# Patient Record
Sex: Female | Born: 1950 | Race: White | Hispanic: No | Marital: Married | Smoking: Current every day smoker
Health system: Southern US, Community
[De-identification: ages and names within clinical notes are randomized; demographics above are authoritative.]

## PROBLEM LIST (undated history)

## (undated) DIAGNOSIS — J449 Chronic obstructive pulmonary disease, unspecified: Secondary | ICD-10-CM

## (undated) DIAGNOSIS — C801 Malignant (primary) neoplasm, unspecified: Secondary | ICD-10-CM

## (undated) DIAGNOSIS — Z720 Tobacco use: Secondary | ICD-10-CM

## (undated) DIAGNOSIS — F209 Schizophrenia, unspecified: Secondary | ICD-10-CM

## (undated) HISTORY — PX: NO PAST SURGERIES: SHX2092

## (undated) HISTORY — PX: OTHER SURGICAL HISTORY: SHX169

---

## 2000-09-23 ENCOUNTER — Inpatient Hospital Stay (HOSPITAL_COMMUNITY): Admission: EM | Admit: 2000-09-23 | Discharge: 2000-09-28 | Payer: Self-pay | Admitting: *Deleted

## 2001-01-19 ENCOUNTER — Inpatient Hospital Stay (HOSPITAL_COMMUNITY): Admission: EM | Admit: 2001-01-19 | Discharge: 2001-01-25 | Payer: Self-pay | Admitting: *Deleted

## 2001-07-22 ENCOUNTER — Inpatient Hospital Stay (HOSPITAL_COMMUNITY): Admission: EM | Admit: 2001-07-22 | Discharge: 2001-08-05 | Payer: Self-pay | Admitting: *Deleted

## 2002-06-07 ENCOUNTER — Inpatient Hospital Stay (HOSPITAL_COMMUNITY): Admission: EM | Admit: 2002-06-07 | Discharge: 2002-06-13 | Payer: Self-pay | Admitting: Psychiatry

## 2016-08-27 NOTE — BH Assessment (Signed)
Writer followed up with [facility] and provided her with the call report number and accepting physician. She stated she was unaware of the admission.

## 2016-08-27 NOTE — BH Assessment (Signed)
Patient has been accepted to [facility].  Accepting physician is Dr. Jerilee Hoh.  Attending Physician will be Dr. Jerilee Hoh .  Patient has been assigned to room 322, by [facility].  Call report to [phone].  Representative/Transfer Coordinator is Galadriel Shroff Patient pre-admitted by [facility] Patient [facility] ([place])   [facility] Staff ([place], [phone]) made aware of acceptance.
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
## 2016-08-28 ENCOUNTER — Inpatient Hospital Stay: Payer: Medicare Other

## 2016-08-28 ENCOUNTER — Inpatient Hospital Stay
Admission: RE | Admit: 2016-08-28 | Discharge: 2016-09-04 | DRG: 885 | Payer: Medicare Other | Source: Ambulatory Visit | Attending: Psychiatry | Admitting: Psychiatry

## 2016-08-28 DIAGNOSIS — Z888 Allergy status to other drugs, medicaments and biological substances status: Secondary | ICD-10-CM

## 2016-08-28 DIAGNOSIS — R627 Adult failure to thrive: Secondary | ICD-10-CM | POA: Diagnosis present

## 2016-08-28 DIAGNOSIS — Z818 Family history of other mental and behavioral disorders: Secondary | ICD-10-CM | POA: Diagnosis not present

## 2016-08-28 DIAGNOSIS — Z972 Presence of dental prosthetic device (complete) (partial): Secondary | ICD-10-CM | POA: Diagnosis not present

## 2016-08-28 DIAGNOSIS — C3491 Malignant neoplasm of unspecified part of right bronchus or lung: Secondary | ICD-10-CM | POA: Diagnosis present

## 2016-08-28 DIAGNOSIS — F209 Schizophrenia, unspecified: Secondary | ICD-10-CM | POA: Diagnosis present

## 2016-08-28 DIAGNOSIS — C7972 Secondary malignant neoplasm of left adrenal gland: Secondary | ICD-10-CM | POA: Diagnosis present

## 2016-08-28 DIAGNOSIS — J441 Chronic obstructive pulmonary disease with (acute) exacerbation: Secondary | ICD-10-CM | POA: Diagnosis present

## 2016-08-28 DIAGNOSIS — F329 Major depressive disorder, single episode, unspecified: Secondary | ICD-10-CM | POA: Diagnosis present

## 2016-08-28 DIAGNOSIS — F22 Delusional disorders: Secondary | ICD-10-CM | POA: Diagnosis present

## 2016-08-28 DIAGNOSIS — C3490 Malignant neoplasm of unspecified part of unspecified bronchus or lung: Secondary | ICD-10-CM

## 2016-08-28 DIAGNOSIS — IMO0002 Reserved for concepts with insufficient information to code with codable children: Secondary | ICD-10-CM

## 2016-08-28 DIAGNOSIS — C779 Secondary and unspecified malignant neoplasm of lymph node, unspecified: Secondary | ICD-10-CM | POA: Diagnosis present

## 2016-08-28 DIAGNOSIS — Z5320 Procedure and treatment not carried out because of patient's decision for unspecified reasons: Secondary | ICD-10-CM | POA: Diagnosis present

## 2016-08-28 DIAGNOSIS — G4700 Insomnia, unspecified: Secondary | ICD-10-CM | POA: Diagnosis present

## 2016-08-28 DIAGNOSIS — R0602 Shortness of breath: Secondary | ICD-10-CM

## 2016-08-28 DIAGNOSIS — C799 Secondary malignant neoplasm of unspecified site: Secondary | ICD-10-CM

## 2016-08-28 DIAGNOSIS — C3431 Malignant neoplasm of lower lobe, right bronchus or lung: Secondary | ICD-10-CM | POA: Diagnosis not present

## 2016-08-28 DIAGNOSIS — Z9114 Patient's other noncompliance with medication regimen: Secondary | ICD-10-CM

## 2016-08-28 DIAGNOSIS — F17210 Nicotine dependence, cigarettes, uncomplicated: Secondary | ICD-10-CM | POA: Diagnosis present

## 2016-08-28 DIAGNOSIS — I959 Hypotension, unspecified: Secondary | ICD-10-CM | POA: Diagnosis present

## 2016-08-28 DIAGNOSIS — J449 Chronic obstructive pulmonary disease, unspecified: Secondary | ICD-10-CM | POA: Diagnosis not present

## 2016-08-28 DIAGNOSIS — C7971 Secondary malignant neoplasm of right adrenal gland: Secondary | ICD-10-CM | POA: Diagnosis present

## 2016-08-28 DIAGNOSIS — Z638 Other specified problems related to primary support group: Secondary | ICD-10-CM | POA: Diagnosis not present

## 2016-08-28 DIAGNOSIS — Z66 Do not resuscitate: Secondary | ICD-10-CM | POA: Diagnosis not present

## 2016-08-28 DIAGNOSIS — R42 Dizziness and giddiness: Secondary | ICD-10-CM | POA: Diagnosis not present

## 2016-08-28 DIAGNOSIS — F17200 Nicotine dependence, unspecified, uncomplicated: Secondary | ICD-10-CM

## 2016-08-28 DIAGNOSIS — C7951 Secondary malignant neoplasm of bone: Secondary | ICD-10-CM | POA: Diagnosis present

## 2016-08-28 DIAGNOSIS — R229 Localized swelling, mass and lump, unspecified: Secondary | ICD-10-CM

## 2016-08-28 DIAGNOSIS — Z515 Encounter for palliative care: Secondary | ICD-10-CM

## 2016-08-28 DIAGNOSIS — R918 Other nonspecific abnormal finding of lung field: Secondary | ICD-10-CM | POA: Diagnosis not present

## 2016-08-28 DIAGNOSIS — R634 Abnormal weight loss: Secondary | ICD-10-CM | POA: Diagnosis not present

## 2016-08-28 HISTORY — DX: Tobacco use: Z72.0

## 2016-08-28 HISTORY — DX: Schizophrenia, unspecified: F20.9

## 2016-08-28 HISTORY — DX: Chronic obstructive pulmonary disease, unspecified: J44.9

## 2016-08-28 LAB — COMPREHENSIVE METABOLIC PANEL
ALBUMIN: 2.3 g/dL — AB (ref 3.5–5.0)
ALT: 17 U/L (ref 14–54)
ANION GAP: 8 (ref 5–15)
AST: 22 U/L (ref 15–41)
Alkaline Phosphatase: 87 U/L (ref 38–126)
BILIRUBIN TOTAL: 0.4 mg/dL (ref 0.3–1.2)
BUN: 13 mg/dL (ref 6–20)
CO2: 27 mmol/L (ref 22–32)
Calcium: 8.2 mg/dL — ABNORMAL LOW (ref 8.9–10.3)
Chloride: 98 mmol/L — ABNORMAL LOW (ref 101–111)
Creatinine, Ser: 0.57 mg/dL (ref 0.44–1.00)
GFR calc Af Amer: >60 mL/min (ref >60)
GFR calc non Af Amer: >60 mL/min (ref >60)
GLUCOSE: 74 mg/dL (ref 65–99)
POTASSIUM: 4.2 mmol/L (ref 3.5–5.1)
SODIUM: 133 mmol/L — AB (ref 135–145)
TOTAL PROTEIN: 6.5 g/dL (ref 6.5–8.1)

## 2016-08-28 LAB — URINALYSIS, COMPLETE (UACMP) WITH MICROSCOPIC
BILIRUBIN URINE: NEGATIVE
Glucose, UA: NEGATIVE mg/dL
KETONES UR: NEGATIVE mg/dL
NITRITE: NEGATIVE
PROTEIN: NEGATIVE mg/dL
Specific Gravity, Urine: 1.011 (ref 1.005–1.030)
pH: 7 (ref 5.0–8.0)

## 2016-08-28 LAB — CBC
HEMATOCRIT: 26.9 % — AB (ref 35.0–47.0)
Hemoglobin: 8.8 g/dL — ABNORMAL LOW (ref 12.0–16.0)
MCH: 25 pg — ABNORMAL LOW (ref 26.0–34.0)
MCHC: 32.7 g/dL (ref 32.0–36.0)
MCV: 76.6 fL — ABNORMAL LOW (ref 80.0–100.0)
PLATELETS: 401 10*3/uL (ref 150–440)
RBC: 3.51 MIL/uL — ABNORMAL LOW (ref 3.80–5.20)
RDW: 19.7 % — AB (ref 11.5–14.5)
WBC: 11.2 10*3/uL — ABNORMAL HIGH (ref 3.6–11.0)

## 2016-08-28 LAB — LIPID PANEL
Cholesterol: 229 mg/dL — ABNORMAL HIGH (ref 0–200)
HDL: 32 mg/dL — ABNORMAL LOW (ref >40)
LDL CALC: 176 mg/dL — AB (ref 0–99)
TRIGLYCERIDES: 107 mg/dL (ref <150)
Total CHOL/HDL Ratio: 7.2 RATIO
VLDL: 21 mg/dL (ref 0–40)

## 2016-08-28 LAB — INFLUENZA PANEL BY PCR (TYPE A & B)
INFLAPCR: NEGATIVE
INFLBPCR: NEGATIVE

## 2016-08-28 LAB — VITAMIN B12: Vitamin B-12: 234 pg/mL (ref 180–914)

## 2016-08-28 LAB — TSH: TSH: 34.429 u[IU]/mL — AB (ref 0.350–4.500)

## 2016-08-28 MED ORDER — FLUPHENAZINE HCL 5 MG PO TABS
5.0000 mg | ORAL_TABLET | Freq: Two times a day (BID) | ORAL | Status: DC
  Administered 2016-08-28 – 2016-08-29 (×3): 5 mg via ORAL
  Filled 2016-08-28 (×4): qty 1

## 2016-08-28 MED ORDER — ALUM & MAG HYDROXIDE-SIMETH 200-200-20 MG/5ML PO SUSP: 30.0000 mL | ORAL | Status: DC | PRN

## 2016-08-28 MED ORDER — NICOTINE 14 MG/24HR TD PT24: 14.0000 mg | MEDICATED_PATCH | Freq: Every day | TRANSDERMAL | Status: DC

## 2016-08-28 MED ORDER — NICOTINE 21 MG/24HR TD PT24
21.0000 mg | MEDICATED_PATCH | Freq: Every day | TRANSDERMAL | Status: DC
  Administered 2016-08-28 – 2016-09-01 (×5): 21 mg via TRANSDERMAL
  Filled 2016-08-28 (×7): qty 1

## 2016-08-28 MED ORDER — IOPAMIDOL (ISOVUE-300) INJECTION 61%: 75.0000 mL | Freq: Once | INTRAVENOUS | Status: DC | PRN

## 2016-08-28 MED ORDER — DIPHENHYDRAMINE HCL 25 MG PO CAPS: 25.0000 mg | ORAL_CAPSULE | Freq: Two times a day (BID) | ORAL | Status: DC

## 2016-08-28 MED ORDER — AMANTADINE HCL 100 MG PO CAPS
100.0000 mg | ORAL_CAPSULE | Freq: Two times a day (BID) | ORAL | Status: DC
  Administered 2016-08-28 – 2016-08-31 (×6): 100 mg via ORAL
  Filled 2016-08-28 (×7): qty 1

## 2016-08-28 MED ORDER — HALOPERIDOL 5 MG PO TABS: 5.0000 mg | ORAL_TABLET | Freq: Two times a day (BID) | ORAL | Status: DC

## 2016-08-28 MED ORDER — ALBUTEROL SULFATE HFA 108 (90 BASE) MCG/ACT IN AERS
1.0000 | INHALATION_SPRAY | RESPIRATORY_TRACT | Status: DC | PRN
  Filled 2016-08-28 (×3): qty 6.7

## 2016-08-28 MED ORDER — ADULT MULTIVITAMIN W/MINERALS CH
1.0000 | ORAL_TABLET | Freq: Every day | ORAL | Status: DC
  Administered 2016-08-28 – 2016-09-03 (×6): 1 via ORAL
  Filled 2016-08-28 (×6): qty 1

## 2016-08-28 MED ORDER — TUBERCULIN PPD 5 UNIT/0.1ML ID SOLN
5.0000 [IU] | Freq: Once | INTRADERMAL | Status: AC
  Administered 2016-08-28: 5 [IU] via INTRADERMAL
  Filled 2016-08-28 (×2): qty 0.1

## 2016-08-28 MED ORDER — MAGNESIUM HYDROXIDE 400 MG/5ML PO SUSP: 30.0000 mL | Freq: Every day | ORAL | Status: DC | PRN

## 2016-08-28 MED ORDER — ENSURE ENLIVE PO LIQD
237.0000 mL | Freq: Three times a day (TID) | ORAL | Status: DC
  Administered 2016-08-28 – 2016-09-02 (×16): 237 mL via ORAL

## 2016-08-28 MED ORDER — DIPHENHYDRAMINE HCL 25 MG PO CAPS: 25.0000 mg | ORAL_CAPSULE | Freq: Every day | ORAL | Status: DC

## 2016-08-28 MED ORDER — MOMETASONE FURO-FORMOTEROL FUM 200-5 MCG/ACT IN AERO
2.0000 | INHALATION_SPRAY | Freq: Two times a day (BID) | RESPIRATORY_TRACT | Status: DC
  Administered 2016-08-28 – 2016-09-04 (×13): 2 via RESPIRATORY_TRACT
  Filled 2016-08-28 (×2): qty 8.8

## 2016-08-28 MED ORDER — LORAZEPAM 2 MG PO TABS: 2.0000 mg | ORAL_TABLET | ORAL | Status: DC | PRN

## 2016-08-28 MED ORDER — IOPAMIDOL (ISOVUE-300) INJECTION 61%
60.0000 mL | Freq: Once | INTRAVENOUS | Status: AC | PRN
  Administered 2016-08-28: 60 mL via INTRAVENOUS

## 2016-08-28 MED ORDER — ALBUTEROL SULFATE (2.5 MG/3ML) 0.083% IN NEBU
2.5000 mg | INHALATION_SOLUTION | Freq: Four times a day (QID) | RESPIRATORY_TRACT | Status: DC | PRN
  Filled 2016-08-28: qty 3

## 2016-08-28 MED ORDER — ACETAMINOPHEN 325 MG PO TABS
650.0000 mg | ORAL_TABLET | Freq: Four times a day (QID) | ORAL | Status: DC | PRN
  Administered 2016-09-01: 650 mg via ORAL
  Filled 2016-08-28: qty 2

## 2016-08-28 MED ORDER — TRAZODONE HCL 50 MG PO TABS
25.0000 mg | ORAL_TABLET | Freq: Every evening | ORAL | Status: DC | PRN
  Administered 2016-08-29: 25 mg via ORAL
  Filled 2016-08-28 (×2): qty 1
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
## 2016-08-28 NOTE — Plan of Care (Signed)
Problem: Education: Goal: Will be free of psychotic symptoms Outcome: Not Progressing Reports "the scratches on my back are where the food comes out." Redirection ongoing.

## 2016-08-28 NOTE — BHH Suicide Risk Assessment (Signed)
[facility] Admission Suicide Risk Assessment   Nursing information obtained from:    Demographic factors:    Current Mental Status:    Loss Factors:    Historical Factors:    Risk Reduction Factors:     Total Time spent with patient: 1 hour Principal Problem: Schizophrenia ([place]) Diagnosis:   Patient Active Problem List   Diagnosis Date Noted  . Schizophrenia ([place]) [F20.9] 08/28/2016  . COPD (chronic obstructive pulmonary disease) ([place]) [J44.9] 08/28/2016  . Tobacco use disorder [F17.200] 08/28/2016   Subjective Data:   Continued Clinical Symptoms:  Alcohol Use Disorder Identification Test Final Score (AUDIT): 0 The "Alcohol Use Disorders Identification Test", Guidelines for Use in Primary Care, Second Edition.  World Pharmacologist [facility]). Score between 0-7:  no or low risk or alcohol related problems. Score between 8-15:  moderate risk of alcohol related problems. Score between 16-19:  high risk of alcohol related problems. Score 20 or above:  warrants further diagnostic evaluation for alcohol dependence and treatment.   Psychiatric Specialty Exam: Physical Exam  ROS  Blood pressure 103/70, pulse (!) 105, temperature 98.2 F (36.8 C), temperature source Oral, resp. rate 18, height '5\' 1"'$  (1.549 m), weight 42.2 kg (93 lb), SpO2 99 %.Body mass index is 17.57 kg/m.                                                    Sleep:         COGNITIVE FEATURES THAT CONTRIBUTE TO RISK:  Loss of executive function    SUICIDE RISK:   Moderate:  Frequent suicidal ideation with limited intensity, and duration, some specificity in terms of plans, no associated intent, good self-control, limited dysphoria/symptomatology, some risk factors present, and identifiable protective factors, including available and accessible social support.  PLAN OF CARE: admit to [facility]  I certify that inpatient services furnished can reasonably be expected to improve the patient's condition.    Hildred Priest, MD 08/28/2016, 2:02 PM

## 2016-08-28 NOTE — Progress Notes (Signed)
Patient ID: Haley Arias, female   DOB: Feb 24, 1951, 66 y.o.   MRN: [id number]  CSW attempted to contact petitioner, [name] [phone]) APS worker assigned to patient's case. No answer, CSW left voicemail providing CSW contact information asking for a returned call.  Shelbylynn Walczyk G. [place], [place] 08/28/2016 3:01 PM

## 2016-08-28 NOTE — BHH Counselor (Signed)
Adult Comprehensive Assessment  Patient ID: Haley Arias, female   DOB: Oct 26, 1950, 66 y.o.   MRN: [id number]  Information Source: Information source: Patient  Current Stressors:  Educational / Learning stressors: n/a Employment / Job issues: Pt is on disability. Pt receives about $753 dollars a month.  Family Relationships: n/a Museum/gallery curator / Lack of resources (include bankruptcy): n/a Housing / Lack of housing: Pt was living alone before coming to hospital but was not taking care of herself.  Physical health (include injuries & life threatening diseases): Vision problems, ambulatory issues, slurring of speech.  Social relationships: n/a Substance abuse: Patient denies Bereavement / Loss: Both parents are deceased.   Living/Environment/Situation:  Living Arrangements: Alone  Family History:  Marital status: Married Number of Years Married: 5 What types of issues is patient dealing with in the relationship?: Pt states they do not live together but did not state any reason. Additional relationship information: n/a Are you sexually active?: No What is your sexual orientation?: heterosexual Has your sexual activity been affected by drugs, alcohol, medication, or emotional stress?: n/a Does patient have children?: No  Childhood History:  By whom was/is the patient raised?: Both parents Additional childhood history information: n/a Description of patient's relationship with caregiver when they were a child: Pt states she had a good childhood/ reports no abuse.  Patient's description of current relationship with people who raised him/her: Both parents are deceased.  How were you disciplined when you got in trouble as a child/adolescent?: n/a Does patient have siblings?: Yes Number of Siblings: 2 Description of patient's current relationship with siblings: 1 brother and 1 sister. Patient states she has a good relationship with them.  Did patient suffer any  verbal/emotional/physical/sexual abuse as a child?: No Did patient suffer from severe childhood neglect?: No Has patient ever been sexually abused/assaulted/raped as an adolescent or adult?: No Was the patient ever a victim of a crime or a disaster?: No Witnessed domestic violence?: No Has patient been effected by domestic violence as an adult?: No  Education:  Highest grade of school patient has completed: GED Currently a Ship broker?: No Learning disability?: No  Employment/Work Situation:   Employment situation: On disability Why is patient on disability: Medical Reason How long has patient been on disability: 3 years.  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  Has patient ever been in the [employer]?: No Has patient ever served in combat?: No Did You Receive Any Psychiatric Treatment/Services While in the [employer]?: No Are There Guns or Other Weapons in [place]?: No Are These [place]?:  (n/a)  Financial Resources:   Financial resources: Murriel Hopper, [employer], Private insurance Does patient have a Programmer, applications or guardian?: Yes  Alcohol/Substance Abuse:   What has been your use of drugs/alcohol within the last 12 months?: Patient denies.  If attempted suicide, did drugs/alcohol play a role in this?: No Alcohol/Substance Abuse Treatment Hx: Denies past history If yes, describe treatment: n/a Has alcohol/substance abuse ever caused legal problems?: No  Social Support System:   Patient's Community Support System: Good Describe Community Support System: Brother, sisters, Haley Arias(husband), and APS worker Type of faith/religion: n/a How does patient's faith help to cope with current illness?: n/a  Leisure/Recreation:   Leisure and Hobbies: sitting outside and talking on the phone with  relatives  Strengths/Needs:  What things does the patient do well?: good listener In what areas does patient struggle / problems for patient: "I just need some help with my independence".  Discharge Plan:   Does patient have access to transportation?: Yes Will patient be returning to same living situation after discharge?:  (CSW still assessing appropriate discharge plan. ) Currently receiving community mental health services: Yes (From Whom) (Pt reports she sees Dr. Tommi Arias at [facility] in [place]) Does patient have financial barriers related to discharge medications?: No  Summary/Recommendations:   Patient is a 66 year old female admitted  with a diagnosis of Schizophrenia. Information was obtained from psychosocial assessment completed with patient and chart review conducted by this evaluator. Patient presented to the hospital at [facility] emergency department under involuntary commitment on March 6. Per the petitioner, Haley Arias [phone]) pt's APS SW, patient has been refusing to take her medications, he has not been taking care of her ADLs, she was found walking around apartment complex naked. Patient reports primary triggers for admission were not being able to eat and needing assistance with her independence. Patient will benefit from crisis stabilization, medication evaluation, group therapy and psycho education in addition to case management for discharge. At discharge, it is recommended that patient remain compliant with established discharge plan and continued treatment.   Haley Arias G. [place], [place] 08/28/2016 4:21 PM

## 2016-08-29 ENCOUNTER — Inpatient Hospital Stay: Payer: Medicare Other

## 2016-08-29 ENCOUNTER — Encounter: Payer: Self-pay | Admitting: Internal Medicine

## 2016-08-29 LAB — HEMOGLOBIN A1C
HEMOGLOBIN A1C: 5.4 % (ref 4.8–5.6)
MEAN PLASMA GLUCOSE: 108 mg/dL

## 2016-08-29 LAB — GLUCOSE, CAPILLARY: Glucose-Capillary: 114 mg/dL — ABNORMAL HIGH (ref 65–99)

## 2016-08-29 MED ORDER — PREDNISONE 10 MG PO TABS
40.0000 mg | ORAL_TABLET | Freq: Every day | ORAL | Status: AC
  Administered 2016-08-29 – 2016-09-01 (×4): 40 mg via ORAL
  Filled 2016-08-29 (×4): qty 4

## 2016-08-29 MED ORDER — FLUPHENAZINE HCL 5 MG PO TABS: 2.5000 mg | ORAL_TABLET | Freq: Two times a day (BID) | ORAL | Status: DC

## 2016-08-29 NOTE — Consult Note (Signed)
[place] [facility] [place] NAME: Haley Arias    MR#:  [id number]  DATE OF BIRTH:  04-01-1951  DATE OF ADMISSION:  08/28/2016  PRIMARY CARE PHYSICIAN: No PCP Per Patient   CONSULT REQUESTING/REFERRING PHYSICIAN: Dr. Jerilee Hoh  REASON FOR CONSULT: Lung mass  CHIEF COMPLAINT:  No chief complaint on file.   HISTORY OF PRESENT ILLNESS:  Haley Arias  is a 66 y.o. female with a known history of Tobacco use, COPD, schizophrenia admitted to [facility] for schizophrenia.  Patient is not a reliable historian. Most of the history obtained from patient's chart and discussing with nursing staff and Dr. Jerilee Hoh. APS was called by patient's sister from [place]. Patient has been unable to take care of herself. She had stopped taking all her medications as she wanted to get pregnant. As per family she has lost close to 50 pounds over the past few months.  Haley Arias tells me that she smoked 3 packs of cigarettes a week back and has been coughing more since then. Has been smoking since she was 66 years old. Has some mild wheezing but no shortness of breath. Dry cough. No chest pain, lightheadedness. No leg swelling or orthopnea.  Due to her wheezing a chest x-ray was done here in the behavioral health unit which showed a large right-sided lung mass. CT scan of the chest was done following this which shows 8 cm necrotic lung mass extending into the mediastinum and left atrium. Also extending into the pulmonary vein. Patient is  unaware of any lung mass in the past.  Patient is unable to make her decisions at this time. Adult protective services have been involved in her case. She tells me she has been married for 5 years and her husband lives in a separate apartment in the same apartment complex. She has a brother and sister who she is not close to. No children.  He was to go home at this time. Does not want to be intubated or resuscitated. She does not want any treatment for  her lung mass.  PAST MEDICAL HISTORY:   Past Medical History:  Diagnosis Date  . COPD (chronic obstructive pulmonary disease) ([place])   . Schizophrenia ([place])   . Tobacco use     PAST SURGICAL HISTOIRY:  History reviewed. No pertinent surgical history.  SOCIAL HISTORY:   Social History  Substance Use Topics  . Smoking status: Current Every Day Smoker    Packs/day: 1.00    Types: Cigarettes  . Smokeless tobacco: Current User  . Alcohol use Not on file    FAMILY HISTORY:  History reviewed. No pertinent family history. Unobtainable due to patient's schizophrenia  DRUG ALLERGIES:   Allergies  Allergen Reactions  . Cogentin [Benztropine] Anxiety    REVIEW OF SYSTEMS:   ROS  CONSTITUTIONAL: No fever, fatigue or weakness.  EYES: No blurred or double vision.  EARS, NOSE, AND THROAT: No tinnitus or ear pain.  RESPIRATORY: Positive for wheezing  CARDIOVASCULAR: No chest pain, orthopnea, edema.  GASTROINTESTINAL: No nausea, vomiting, diarrhea or abdominal pain.  GENITOURINARY: No dysuria, hematuria.  ENDOCRINE: No polyuria, nocturia,  HEMATOLOGY: No anemia, easy bruising or bleeding SKIN: No rash or lesion. MUSCULOSKELETAL: No joint pain or arthritis.   NEUROLOGIC: No tingling, numbness, weakness.  PSYCHIATRY: No anxiety or depression.   MEDICATIONS AT HOME:   Prior to Admission medications   Not on File      VITAL SIGNS:  Blood pressure (!) 77/51, pulse  93, temperature 97.8 F (36.6 C), temperature source Oral, resp. rate 18, height '5\' 1"'$  (1.549 m), weight 42.2 kg (93 lb), SpO2 99 %. Repeat blood pressure is 96/60  PHYSICAL EXAMINATION:  GENERAL:  66 y.o.-year-old patient lying in the bed with no acute distress.  EYES: Pupils equal, round, reactive to light and accommodation. No scleral icterus. Extraocular muscles intact.  HEENT: Head atraumatic, normocephalic. Oropharynx and nasopharynx clear.  NECK:  Supple, no jugular venous distention. No thyroid  enlargement, no tenderness.  LUNGS: Mild bilateral wheezing CARDIOVASCULAR: S1, S2 normal. No murmurs, rubs, or gallops.  ABDOMEN: Soft, nontender, nondistended. Bowel sounds present. No organomegaly or mass.  EXTREMITIES: No pedal edema, cyanosis, or clubbing.  NEUROLOGIC: Cranial nerves II through XII are intact. Muscle strength 5/5 in all extremities. Sensation intact. Gait not checked.  PSYCHIATRIC: The patient is alert and Awake. Has tangential thoughts. Is oriented 3. Poor attention span. SKIN: No obvious rash, lesion, or ulcer.   LABORATORY PANEL:   CBC  Recent Labs Lab 08/28/16 1451  WBC 11.2*  HGB 8.8*  HCT 26.9*  PLT 401   ------------------------------------------------------------------------------------------------------------------  Chemistries   Recent Labs Lab 08/28/16 1451  NA 133*  K 4.2  CL 98*  CO2 27  GLUCOSE 74  BUN 13  CREATININE 0.57  CALCIUM 8.2*  AST 22  ALT 17  ALKPHOS 87  BILITOT 0.4   ------------------------------------------------------------------------------------------------------------------  Cardiac Enzymes No results for input(s): TROPONINI in the last 168 hours. ------------------------------------------------------------------------------------------------------------------  RADIOLOGY:  Dg Chest 2 View  Result Date: 08/28/2016 CLINICAL DATA:  Smoker, shortness of breath EXAM: CHEST  2 VIEW COMPARISON:  09/12/2007 FINDINGS: Large mass in the right lower lobe. Small right pleural effusion. Left lung is clear. No left pleural effusion. No pneumothorax. Stable cardiomediastinal silhouette. The heart and mediastinal contours are unremarkable. The osseous structures are unremarkable. IMPRESSION: Large right lower lobe mass. Findings concerning for malignancy until proven otherwise. Further evaluation with a CT of the chest is recommended. Electronically Signed   By: Kathreen Devoid   On: 08/28/2016 14:49   Ct Chest W Contrast  Result  Date: 08/28/2016 CLINICAL DATA:  Chest mass seen on x-ray. EXAM: CT CHEST WITH CONTRAST TECHNIQUE: Multidetector CT imaging of the chest was performed during intravenous contrast administration. CONTRAST:  23m ISOVUE-300 IOPAMIDOL (ISOVUE-300) INJECTION 61% COMPARISON:  Chest x-ray from earlier today. FINDINGS: Cardiovascular: Heart size normal. Coronary artery calcification is noted. Atherosclerotic calcification is noted in the wall of the thoracic aorta. A large right lower lobe mass has invaded the right inferior pulmonary vein and grown into the left atrium with evidence of tumor/thrombus extending to the mitral valve. Mediastinum/Nodes: Necrotic 2.3 cm right hilar lymph node is associated with a necrotic 3.0 cm short axis subcarinal lymph node. The confluent mass in the right lower lobe and dates the right mainstem bronchus and occludes the bronchus intermedius. Lungs/Pleura: Emphysema with bronchial wall thickening is noted bilaterally. A large 8.5 x 6.8 cm necrotic mass is identified in the right lower lobe, growing into the right hilum and mediastinum with invasion of the right mainstem bronchus and right inferior pulmonary vein with tumor/thrombus visualized in the left atrium extending to the mitral valve. Right lower lobe collapse around a large mass lesion is compatible with the airway involvement. Upper Abdomen: 4.3 cm heterogeneous right adrenal mass is been incompletely visualized. 3.1 cm lesion identified left adrenal gland. Musculoskeletal: 2.8 x 2.3 cm lesion identified in the anterior right fifth rib. IMPRESSION: 1. 9 cm necrotic  right lower lobe mass extends into the right hilum and mediastinum. There is direct tumor invasion of the right inferior pulmonary vein with tumor extending through the left atrium to the level of the mitral valve. There is also evidence of invasion into the right mainstem bronchus with occlusion of the bronchus intermedius. 2. Metastatic lymphadenopathy in the right  hilum and mediastinum. 3. Bilateral adrenal metastases. 4. Bony metastasis involving the right fifth rib. Electronically Signed   By: Misty Stanley M.D.   On: 08/28/2016 20:25    EKG:   Orders placed or performed during the hospital encounter of 08/28/16  . EKG 12-Lead  . 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 Also has right fifth rib and bilateral adrenal metastasis. Stage IV. Her condition seems terminal with poor prognosis. I have called Dr. Jamal Collin of pulmonary who will review her imaging studies and will discuss her prognosis. Patient has poor understanding of her health care situation. At this time the important thing is to see who would make healthcare decisions for the patient. APS has been involved.   * Mild COPD exacerbation. On albuterol when necessary. I will start oral prednisone.  * Schizophrenia with psychosis Presently being treated in the behavioral health unit  * Tobacco abuse  All the records are reviewed and case discussed with Consulting provider. Management plans discussed with the patient, family and they are in agreement.  CODE STATUS: Presumed full code  TOTAL TIME TAKING CARE OF THIS PATIENT: 40 minutes.    Hillary Bow R M.D on 08/29/2016 at 10:31 AM  Between 7am to 6pm - Pager - [phone]  After 6pm go to [email or web address] - password [email or web address] [place] [facility]  Office  [phone]  CC: Primary care Physician: No PCP Per Patient     Note: This dictation was prepared with Dragon dictation along with smaller phrase technology. Any transcriptional errors that result from this process are unintentional.

## 2016-08-29 NOTE — Progress Notes (Signed)
Received call from Dr. Jerilee Hoh and informed her of patient complaint of dizziness and her vital signs.

## 2016-08-29 NOTE — BHH Group Notes (Signed)
[facility] LCSW Group Therapy  08/29/2016 3:36 PM  Type of Therapy:  Group Therapy  Participation Level:  Patient did not attend group. CSW invited patient to group.   Summary of Progress/Problems: Goal Setting: The objective is to set goals as they relate to the crisis in which they were admitted. Patients will be using SMART goal modalities to set measurable goals. Characteristics of realistic goals will be discussed and patients will be assisted in setting and processing how one will reach their goal. Facilitator will also assist patients in applying interventions and coping skills learned in psycho-education groups to the SMART goal and process how one will achieve defined goal.  Haley Arias G. [place], [place] 08/29/2016, 3:36 PM

## 2016-08-29 NOTE — Progress Notes (Signed)
Advance care planning  Discussed with Dr. Leonidas Romberg and Dr. Janese Banks and Dr. Jerilee Hoh  Difficult situation with not much history available regarding patient's social situation. Patient is unable to make her own decisions. APS was involved. Unclear which family member would be next of kin at this time. HCPOA - Unknown  Patient wants to be DO NOT RESUSCITATE and does not want any treatment for her cancer. But I don't think patient can make her decisions.  Her prognosis will get a CT scan of the abdomen and pelvis and a bone scan. Bronchoscopy after this if recommended by oncology. Overall seems to have poor prognosis due to widespread lung cancer and cardiac invasion.  Further treatment options after CT scan and bone scan and oncology input.  Time spent 25 minutes

## 2016-08-29 NOTE — Plan of Care (Signed)
Problem: Education: Goal: Will be free of psychotic symptoms Outcome: Progressing Patient appears  less anxious.
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
## 2016-08-29 NOTE — BHH Group Notes (Signed)
[facility] Group Notes:  (Nursing/MHT/Case Management/Adjunct)  Date:  08/29/2016  Time:  2:49 AM  Type of Therapy:  Psychoeducational Skills  Participation Level:  Active  Participation Quality:  Appropriate  Affect:  Appropriate  Cognitive:  Appropriate  Insight:  Appropriate  Engagement in Group:  Engaged  Modes of Intervention:  Discussion, Socialization and Support  Summary of Progress/Problems:  Reece Agar 08/29/2016, 2:49 AM

## 2016-08-29 NOTE — Progress Notes (Signed)
D: Pt denies SI/HI/AVH. Pt is pleasant and cooperative, affect is flat but brightens upon approach. Patient is alert to person and time with periods of confusion to place and situation. Patient appears less anxious and she is interacting with peers and staff appropriately.  A: Pt was offered support and encouragement. Pt was given scheduled medications. Pt was encouraged to attend groups. Q 15 minute checks were done for safety.  R:Pt attends groups and interacts well with peers and staff. Pt is taking medication. Pt has no complaints.Pt receptive to treatment and safety maintained on unit.

## 2016-08-29 NOTE — Progress Notes (Signed)
Patient presents to nurses station and states "I feel dizzy." Assisted patient back to room and to bed.  Explained to her not to get out of bed if feels dizzy.  Verbalized understanding.  VS taken and blood sugar done.  See flow sheets.  Dr. Jerilee Hoh paged.

## 2016-08-30 DIAGNOSIS — C799 Secondary malignant neoplasm of unspecified site: Secondary | ICD-10-CM

## 2016-08-30 DIAGNOSIS — C3491 Malignant neoplasm of unspecified part of right bronchus or lung: Secondary | ICD-10-CM

## 2016-08-30 DIAGNOSIS — R634 Abnormal weight loss: Secondary | ICD-10-CM

## 2016-08-30 DIAGNOSIS — J449 Chronic obstructive pulmonary disease, unspecified: Secondary | ICD-10-CM

## 2016-08-30 DIAGNOSIS — F17210 Nicotine dependence, cigarettes, uncomplicated: Secondary | ICD-10-CM

## 2016-08-30 DIAGNOSIS — R918 Other nonspecific abnormal finding of lung field: Secondary | ICD-10-CM

## 2016-08-30 MED ORDER — IOPAMIDOL (ISOVUE-300) INJECTION 61%: 15.0000 mL | INTRAVENOUS | Status: AC

## 2016-08-30 MED ORDER — LORAZEPAM 0.5 MG PO TABS: 0.5000 mg | ORAL_TABLET | Freq: Every evening | ORAL | Status: DC | PRN

## 2016-08-30 MED ORDER — FLUPHENAZINE HCL 5 MG PO TABS
5.0000 mg | ORAL_TABLET | Freq: Every day | ORAL | Status: DC
  Administered 2016-08-30 – 2016-09-03 (×5): 5 mg via ORAL
  Filled 2016-08-30 (×5): qty 1

## 2016-08-30 NOTE — Progress Notes (Signed)
Haley Arias responded to an OR. Pt stated she wanted to speak with a Chaplain. Note also indicated a diagnosis of stage four cancer. On arrival, RN stated the Pt changed her mind and "is good". CH is available for follow up if needed.    08/30/16 1200  Clinical Encounter Type  Visited With Patient  Visit Type Initial;Spiritual support  Referral From Nurse

## 2016-08-30 NOTE — BHH Group Notes (Signed)
[place] LCSW Group Therapy  08/30/2016 2:52 PM  Type of Therapy:  Group Therapy  Participation Level:  Patient did not attend group. CSW invited patient to group.   Summary of Progress/Problems: Communications: Patients identify how individuals communicate with one another appropriately and inappropriately. Patients will be guided to discuss their thoughts, feelings, and behaviors related to barriers when communicating. The group will process together ways to execute positive and appropriate communications.   Raphaela Cannaday G. Dix, [place] 08/30/2016, 2:53 PM

## 2016-08-30 NOTE — Progress Notes (Signed)
Megan RN attempted to give patient contrast in preparation for CT of abdomen.  Patient refused.  Per nurse patient states "I am not going to take because ya'll are trying to poison me."  This writer went in to talk with patient about the contrast.  Patient states "I am not going to do it.  I have talked to my doctor for my head and that is all I care about"  Asked if she was concerned about the mass in her lung and patient states "No I am not."

## 2016-08-30 NOTE — Clinical Social Work Note (Signed)
CSW received verbal consult about possible palliative care due to new diagnosis of lung cancer. CSW gave primary CSW information to contact Flo Shanks, RN liaison for [facility] [place] tomorrow at [phone] for assistance with possible palliative to follow the patient at home/group home/[facility]. CSW will con't to assist in any way needed.  Santiago Bumpers, MSW, Latanya Presser [phone]
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
## 2016-08-30 NOTE — Progress Notes (Addendum)
Patient refused to have MRI and CT scan today.  Mild confusion noted.  No group attendance.  [facility] halls and requires frequent re-direction.  Presents frequently to nurses station with various requests.  Poor appetite.  Support and encouragement offered.  Safety checks maintained.

## 2016-08-30 NOTE — Progress Notes (Signed)
Patient was back and forth to nurse's station during the night. He spent most of the night up and down to look at the clocked. MD paged x 2 to assist with sleeping aid, MD on call never called back. Patient made aware.

## 2016-08-30 NOTE — BHH Group Notes (Signed)
[facility] Group Notes:  (Nursing/MHT/Case Management/Adjunct)  Date:  08/30/2016  Time:  4:06 AM  Type of Therapy:  Psychoeducational Skills  Participation Level:  Active  Participation Quality:  Appropriate and Sharing  Affect:  Appropriate  Cognitive:  Appropriate  Insight:  Appropriate and Good  Engagement in Group:  Engaged  Modes of Intervention:  Discussion, Socialization and Support  Summary of Progress/Problems:  Haley Arias 08/30/2016, 4:06 AM

## 2016-08-30 NOTE — Progress Notes (Signed)
Patient spent most of the evening in her room sitting in bed. She was cooperative with treatment  and she was medication complaint. She couldn't recognize if she was holding a pill or not in her hand. Patient back and forth to the nurses station for the last hour looking at time. MD called and notified of patient's request and not sleeping, awaiting a return phone call.

## 2016-08-30 NOTE — Progress Notes (Signed)
I had a long conversation with patients sister [name] over the phone [phone] at 12.11 pm today. She and her older brother Gerlene Fee have primarily cared for the patient over the years. Patient is married Kalman Shan says but her husband has been a drug addict and in and out of the rehab often.  I explained to [facility] that patient has stage IV lung cancer given bone and bilateral adrenal metastases. That this is not curable even with treatment. Treatment would require doctor visits, blood draws and sitting through infusions. She would need compliance with outpatient follow ups and 24 hour supervision to watch for treatment related side effects. Her prognosis without any treatment is likely <6 months and may be 1-1.5 yrs with treatment.  Rose says that she has not had end of life conversation with her sister ever. But they lost their father when both of them were in their 20's and have seen him go through it. Using substituted judgement she feels that patient would not have wanted to pursue aggressive treatment like chemotherapy for advanced cancer. Patient recently went through colonoscopy and was quite disturbed from it and felt like she went through surgery and couldn't eat well for days thinking that she is going through colonoscopy prep. It is difficult for her sister to focus and sit still for 10 min.   Rose and her brother have discussed this and feel that patient should be kept comfortable at this point and not to be made to go through treatment like chemotherapy or immunotherapy as this would come at a significant cost of discomfort to the patient. She will likely not cooperate with treatment. She has not been able to take her medications for scizhophrenia regularly and her symptoms have not been well controlled.  I will touch base with [name] tomorrow to determine if patients sister and brother can be the patients HCP. If they can be her HCP, then I would recommend having hospice  on board in accordance with their wishes.  I do feel that chemotherapy even if considered would cause more harm than good for the patient given underlying psychiatric issues and would not result in meaningful improvement of her QOL.  Dr. Randa Evens, MD, MPH [facility] at [facility] [phone] 08/30/2016 12:49 PM

## 2016-08-30 NOTE — BHH Suicide Risk Assessment (Addendum)
[name] INPATIENT:  Family/Significant Other Suicide Prevention Education  Suicide Prevention Education:  Education Completed;Rose Vincent(sister [phone]), has been identified by the patient as the family member/significant other with whom the patient will be residing, and identified as the person(s) who will aid the patient in the event of a mental health crisis (suicidal ideations/suicide attempt).  With written consent from the patient, the family member/significant other has been provided the following suicide prevention education, prior to the and/or following the discharge of the patient.  The suicide prevention education provided includes the following:  Suicide risk factors  Suicide prevention and interventions  National Suicide Hotline telephone number  [facility] assessment telephone number  [facility] Emergency Assistance [place] and/or Residential Mobile Crisis Unit telephone number  Request made of family/significant other to:  Remove weapons (e.g., guns, rifles, knives), all items previously/currently identified as safety concern.    Remove drugs/medications (over-the-counter, prescriptions, illicit drugs), all items previously/currently identified as a safety concern.  The family member/significant other verbalizes understanding of the suicide prevention education information provided.  The family member/significant other agrees to remove the items of safety concern listed above.  Haley Arias G. [place], [place] 08/30/2016, 2:56 PM

## 2016-08-30 NOTE — BHH Suicide Risk Assessment (Signed)
[place] INPATIENT:  Family/Significant Other Suicide Prevention Education  Suicide Prevention Education:  Contact Attempts:Rose Vincent(sister [phone]), has been identified by the patient as the family member/significant other with whom the patient will be residing, and identified as the person(s) who will aid the patient in the event of a mental health crisis.  With written consent from the patient, two attempts were made to provide suicide prevention education, prior to and/or following the patient's discharge.  We were unsuccessful in providing suicide prevention education.  A suicide education pamphlet was given to the patient to share with family/significant other.  Date and time of first attempt: 08/30/2016 / 10:19am; left voicemail providing CSW's contact information.    Caera Enwright G. [place] Jabs MSW, [place] 08/30/2016, 10:21 AM

## 2016-08-30 NOTE — Progress Notes (Signed)
[facility] [place] NAME: Haley Arias    MR#:  [id number]  DATE OF BIRTH:  [birth year]  SUBJECTIVE:  CHIEF COMPLAINT:  No chief complaint on file.  Medical consult was called in for a large lung mass. Pt is not able to understand the reality of her disease, she is refusing to have CT or MRI today. REVIEW OF SYSTEMS:  CONSTITUTIONAL: No fever, fatigue or weakness.  EYES: No blurred or double vision.  EARS, NOSE, AND THROAT: No tinnitus or ear pain.  RESPIRATORY: No cough, shortness of breath, wheezing or hemoptysis.  CARDIOVASCULAR: No chest pain, orthopnea, edema.  GASTROINTESTINAL: No nausea, vomiting, diarrhea or abdominal pain.  GENITOURINARY: No dysuria, hematuria.  ENDOCRINE: No polyuria, nocturia,  HEMATOLOGY: No anemia, easy bruising or bleeding SKIN: No rash or lesion. MUSCULOSKELETAL: No joint pain or arthritis.   NEUROLOGIC: No tingling, numbness, weakness.  PSYCHIATRY: No anxiety or depression.   ROS  DRUG ALLERGIES:   Allergies  Allergen Reactions  . Cogentin [Benztropine] Anxiety    VITALS:  Blood pressure 99/63, pulse 91, temperature 97.5 F (36.4 C), temperature source Oral, resp. rate 18, height '5\' 1"'$  (1.549 m), weight 42.2 kg (93 lb), SpO2 100 %.  PHYSICAL EXAMINATION:  GENERAL:  66 y.o.-year-old patient lying in the bed with no acute distress.  EYES: Pupils equal, round, reactive to light and accommodation. No scleral icterus. Extraocular muscles intact.  HEENT: Head atraumatic, normocephalic. Oropharynx and nasopharynx clear.  NECK:  Supple, no jugular venous distention. No thyroid enlargement, no tenderness.  LUNGS: Normal breath sounds bilaterally, no wheezing, rales,rhonchi or crepitation. No use of accessory muscles of respiration.  CARDIOVASCULAR: S1, S2 normal. No murmurs, rubs, or gallops.  ABDOMEN: Soft, nontender, nondistended. Bowel sounds present. No organomegaly or mass.  EXTREMITIES: No pedal  edema, cyanosis, or clubbing.  NEUROLOGIC: Cranial nerves II through XII are intact. Muscle strength 5/5 in all extremities. Sensation intact. Gait not checked.  PSYCHIATRIC: The patient is alert and oriented x 3.  SKIN: No obvious rash, lesion, or ulcer.   Physical Exam LABORATORY PANEL:   CBC  Recent Labs Lab 08/28/16 1451  WBC 11.2*  HGB 8.8*  HCT 26.9*  PLT 401   ------------------------------------------------------------------------------------------------------------------  Chemistries   Recent Labs Lab 08/28/16 1451  NA 133*  K 4.2  CL 98*  CO2 27  GLUCOSE 74  BUN 13  CREATININE 0.57  CALCIUM 8.2*  AST 22  ALT 17  ALKPHOS 87  BILITOT 0.4   ------------------------------------------------------------------------------------------------------------------  Cardiac Enzymes No results for input(s): TROPONINI in the last 168 hours. ------------------------------------------------------------------------------------------------------------------  RADIOLOGY:  No results found.  ASSESSMENT AND PLAN:   Principal Problem:   Schizophrenia ([place]) Active Problems:   COPD (chronic obstructive pulmonary disease) (HCC)   Tobacco use disorder   Lung cancer ([place])   Metastasis (HCC)  * metastatic lung cancer   Mets to bone and adrenal.   Oncology suggested further work ups, but pt is refusing currently.   She is still not well understanding her disease.   We need to have decision about her care with her family and APS and social services.   Social worker and oncology will help on further planning.    * COPD- no acute exacerbation.    Smoking * Schzophrenia   Manage per psych.  Social services to help with discharge planning. As there are no medical issues, where we can provide any further help- I will sign off. Please call ,  if any help needed.   All the records are reviewed and case discussed with Care Management/Social Workerr. Management plans  discussed with the patient, family and they are in agreement.  CODE STATUS: Full.  TOTAL TIME TAKING CARE OF THIS PATIENT: 35 minutes.     POSSIBLE D/C IN 2-3 DAYS, DEPENDING ON CLINICAL CONDITION.   [name] M.D on 08/30/2016   Between 7am to 6pm - Pager - [phone]  After 6pm go to [email or web address] - password [email or web address] [facility]  Office  [phone]  CC: Primary care physician; No PCP Per Patient  Note: This dictation was prepared with Dragon dictation along with smaller phrase technology. Any transcriptional errors that result from this process are unintentional.

## 2016-08-30 NOTE — Consult Note (Addendum)
Hematology/Oncology Consult note [facility] Telephone:([phone] [phone]  Patient Care Team: No Pcp Per Patient as PCP - General (General Practice)   Name of the patient: Haley Arias  [id number]  1950/06/24    Reason for consult: new diagnosis of possible lung cancer/ large lung mass   Requesting physician- Dr. Darvin Neighbours  Date of visit. 08/30/2016    History of presenting illness- Patient is a 66 yr old female who was transferred to [facility] psychiatry unit from [facility] ER where she presented on involuntary commitment, Patients SW is [name] and patient was found walking around apartment complex naked and has not been taking care of her ADL's and refusing to taker her medications. Patients sister [name] also mentioned to Dr. Ledell Noss team that patient has a h/o schizophrenia with multiple hospitalizations in the past and living independently. She has had a progressive decline and weight loss from 130 to 80lb since December 2017.   On admission here, patient had CXR which showed large RLL lung mass. This was followed by CT chest which showed:  1. 9 cm necrotic right lower lobe mass extends into the right hilum and mediastinum. There is direct tumor invasion of the right inferior pulmonary vein with tumor extending through the left atrium to the level of the mitral valve. There is also evidence of invasion into the right mainstem bronchus with occlusion of the bronchus intermedius. 2. Metastatic lymphadenopathy in the right hilum and mediastinum. 3. Bilateral adrenal metastases. 4. Bony metastasis involving the right fifth rib.  Currently patient reports feeling well and denies any symptoms of fatigue or SOB. She understands that she has lung mass but thinks it will get better with deep breath and puffers. She does not want to drink oral contrast for CT scan.  She has a sister who lives in [place] and says she has a brother in  [place]. Also says her husband lives separately in [place].    ECOG PS- 1  Pain scale- 0   Review of systems- limited due to patients mental condition  - Review of Systems  Constitutional: Negative for chills, fever and malaise/fatigue.  Respiratory: Negative for cough, hemoptysis, sputum production and shortness of breath.   Cardiovascular: Negative for chest pain and palpitations.  Gastrointestinal: Negative for abdominal pain, diarrhea, nausea and vomiting.  Genitourinary: Negative for dysuria, frequency and urgency.    Allergies  Allergen Reactions  . Cogentin [Benztropine] Anxiety    Patient Active Problem List   Diagnosis Date Noted  . Schizophrenia ([place]) 08/28/2016  . COPD (chronic obstructive pulmonary disease) ([place]) 08/28/2016  . Tobacco use disorder 08/28/2016     Past Medical History:  Diagnosis Date  . COPD (chronic obstructive pulmonary disease) ([place])   . Schizophrenia ([place])   . Tobacco use      History reviewed. No pertinent surgical history.  Social History   Social History  . Marital status: Married    Spouse name: N/A  . Number of children: N/A  . Years of education: N/A   Occupational History  . Not on file.   Social History Main Topics  . Smoking status: Current Every Day Smoker    Packs/day: 1.00    Types: Cigarettes  . Smokeless tobacco: Current User  . Alcohol use Not on file  . Drug use: Unknown  . Sexual activity: Not on file   Other Topics Concern  . Not on file   Social History Narrative  . No narrative on file  History reviewed. No pertinent family history.   Current Facility-Administered Medications:  .  acetaminophen (TYLENOL) tablet 650 mg, 650 mg, Oral, Q6H PRN, Hildred Priest, MD .  albuterol (PROVENTIL HFA;VENTOLIN HFA) 108 (90 Base) MCG/ACT inhaler 1-2 puff, 1-2 puff, Inhalation, Q4H PRN, Hildred Priest, MD .  albuterol (PROVENTIL) (2.5 MG/3ML) 0.083% nebulizer solution 2.5 mg,  2.5 mg, Nebulization, Q6H PRN, Hildred Priest, MD .  alum & mag hydroxide-simeth (MAALOX/MYLANTA) 200-200-20 MG/5ML suspension 30 mL, 30 mL, Oral, Q4H PRN, Hildred Priest, MD .  amantadine (SYMMETREL) capsule 100 mg, 100 mg, Oral, BID, Hildred Priest, MD, 100 mg at 08/30/16 0919 .  feeding supplement (ENSURE ENLIVE) (ENSURE ENLIVE) liquid 237 mL, 237 mL, Oral, TID BM, Hildred Priest, MD, 237 mL at 08/29/16 2100 .  fluPHENAZine (PROLIXIN) tablet 5 mg, 5 mg, Oral, QHS, Hildred Priest, MD .  magnesium hydroxide (MILK OF MAGNESIA) suspension 30 mL, 30 mL, Oral, Daily PRN, Hildred Priest, MD .  mometasone-formoterol [facility]) 200-5 MCG/ACT inhaler 2 puff, 2 puff, Inhalation, BID, Hildred Priest, MD, 2 puff at 08/30/16 [phone] .  multivitamin with minerals tablet 1 tablet, 1 tablet, Oral, Daily, Hildred Priest, MD, 1 tablet at 08/30/16 0919 .  nicotine (NICODERM CQ - dosed in mg/24 hours) patch 21 mg, 21 mg, Transdermal, Daily, Hildred Priest, MD, 21 mg at 08/30/16 0919 .  predniSONE (DELTASONE) tablet 40 mg, 40 mg, Oral, Q breakfast, Hillary Bow, MD, 40 mg at 08/30/16 0919 .  tuberculin injection 5 Units, 5 Units, Intradermal, Once, Hildred Priest, MD, 5 Units at 08/28/16 2250   Physical exam:  Vitals:   08/29/16 1035 08/29/16 1540 08/30/16 0621 08/30/16 0623  BP: 98/61 (!) 93/55 (!) 105/59 99/63  Pulse: (!) 105 97 91   Resp: 20  18   Temp:   97.5 F (36.4 C)   TempSrc:   Oral   SpO2: 99% 100%    Weight:      Height:       Physical Exam  Constitutional:  Thin but appears in no acute distress. Her hair is matted and dentition is very poor  HENT:  Head: Normocephalic and atraumatic.  Eyes: EOM are normal.  Cataract in left eye  Neck: Normal range of motion.  Cardiovascular: Normal rate, regular rhythm and normal heart sounds.   Pulmonary/Chest: Effort normal and breath sounds  normal.  Abdominal: Soft. Bowel sounds are normal.  Neurological: She is alert.  She is oriented to self only  Skin: Skin is warm and dry.       CMP Latest Ref Rng & Units 08/28/2016  Glucose 65 - 99 mg/dL 74  BUN 6 - 20 mg/dL 13  Creatinine 0.44 - 1.00 mg/dL 0.57  Sodium 135 - 145 mmol/L 133(L)  Potassium 3.5 - 5.1 mmol/L 4.2  Chloride 101 - 111 mmol/L 98(L)  CO2 22 - 32 mmol/L 27  Calcium 8.9 - 10.3 mg/dL 8.2(L)  Total Protein 6.5 - 8.1 g/dL 6.5  Total Bilirubin 0.3 - 1.2 mg/dL 0.4  Alkaline Phos 38 - 126 U/L 87  AST 15 - 41 U/L 22  ALT 14 - 54 U/L 17   CBC Latest Ref Rng & Units 08/28/2016  WBC 3.6 - 11.0 K/uL 11.2(H)  Hemoglobin 12.0 - 16.0 g/dL 8.8(L)  Hematocrit 35.0 - 47.0 % 26.9(L)  Platelets 150 - 440 K/uL 401    '@IMAGES'$ @  Dg Chest 2 View  Result Date: 08/28/2016 CLINICAL DATA:  Smoker, shortness of breath EXAM: CHEST  2 VIEW COMPARISON:  09/12/2007 FINDINGS: Large mass in the right lower lobe. Small right pleural effusion. Left lung is clear. No left pleural effusion. No pneumothorax. Stable cardiomediastinal silhouette. The heart and mediastinal contours are unremarkable. The osseous structures are unremarkable. IMPRESSION: Large right lower lobe mass. Findings concerning for malignancy until proven otherwise. Further evaluation with a CT of the chest is recommended. Electronically Signed   By: Kathreen Devoid   On: 08/28/2016 14:49   Ct Chest W Contrast  Result Date: 08/28/2016 CLINICAL DATA:  Chest mass seen on x-ray. EXAM: CT CHEST WITH CONTRAST TECHNIQUE: Multidetector CT imaging of the chest was performed during intravenous contrast administration. CONTRAST:  2m ISOVUE-300 IOPAMIDOL (ISOVUE-300) INJECTION 61% COMPARISON:  Chest x-ray from earlier today. FINDINGS: Cardiovascular: Heart size normal. Coronary artery calcification is noted. Atherosclerotic calcification is noted in the wall of the thoracic aorta. A large right lower lobe mass has invaded the right inferior  pulmonary vein and grown into the left atrium with evidence of tumor/thrombus extending to the mitral valve. Mediastinum/Nodes: Necrotic 2.3 cm right hilar lymph node is associated with a necrotic 3.0 cm short axis subcarinal lymph node. The confluent mass in the right lower lobe and dates the right mainstem bronchus and occludes the bronchus intermedius. Lungs/Pleura: Emphysema with bronchial wall thickening is noted bilaterally. A large 8.5 x 6.8 cm necrotic mass is identified in the right lower lobe, growing into the right hilum and mediastinum with invasion of the right mainstem bronchus and right inferior pulmonary vein with tumor/thrombus visualized in the left atrium extending to the mitral valve. Right lower lobe collapse around a large mass lesion is compatible with the airway involvement. Upper Abdomen: 4.3 cm heterogeneous right adrenal mass is been incompletely visualized. 3.1 cm lesion identified left adrenal gland. Musculoskeletal: 2.8 x 2.3 cm lesion identified in the anterior right fifth rib. IMPRESSION: 1. 9 cm necrotic right lower lobe mass extends into the right hilum and mediastinum. There is direct tumor invasion of the right inferior pulmonary vein with tumor extending through the left atrium to the level of the mitral valve. There is also evidence of invasion into the right mainstem bronchus with occlusion of the bronchus intermedius. 2. Metastatic lymphadenopathy in the right hilum and mediastinum. 3. Bilateral adrenal metastases. 4. Bony metastasis involving the right fifth rib. Electronically Signed   By: EMisty StanleyM.D.   On: 08/28/2016 20:25    Assessment and plan- Patient is a 66y.o. female with newly found found large RLL lung mass as well as bone and adrenal mets strongly suggestive of metastatic lung cancer  1. Patient has schizophrenia and does not have decision making capacity. She understands that she has lung mass but does not understand that this is cancer which needs  further investigation. She does not want to go through CT scans. At this time we need to get in touch with her SW AOlen Pelfrom APS tomorrow as well as her sister to ascertain who her health care proxy is and what their wishes are. I will try to contact them tomorrow.   2. Patient has stage IV lung cancer but needs tissue biopsy for definitive diagnosis. This can be done through bronchoscopy or peripheral lesions if seen on CT abdomen. Adrenal biopsy is not desirable. She will also need mri brain with and without contrast to r/o brain mets  3. Treatment for stage IV lung cancer is typically chemotherapy or immunotherapy. But treatment is palliative and not curative. Without treatment her prognosis is likely <  6 months. With treatment it could be 1 yr or longer. Before initiating any kind of treatment- she first needs tissue biopsy and secondly she needs a stable outpatient disposition. If treatment is desired by HCP, someone will need to bring her to and from her appointments as well as 24 hour supervision to assess for side effects of treatment. She will also needs frequent blood draws, doctors visits and will need to sit through her infusions as an outpatient. If this cannot be achieved safely, risks of treatment will outweigh benefits and in that case hospice would likely be the better alternative. Recommend having palliative care on board to address goals of care as well. Given her prior social situation as well as non compliance to medications, chemotherapy would be very challenging for her. Also patient lives in [place] and she may have to seek care at [facility] if she is discharged closer to there  4. There should be in person family meeting with APS SW and family member present to address goals of care   Thank you for this kind referral and the opportunity to participate in the care of this patient   Visit Diagnosis 1. SOB (shortness of breath)   2. Mass   3. Lung cancer [facility])       Dr. Randa Evens, MD, MPH [facility] at [facility] Pager- [phone] 08/30/2016  10:57 AM

## 2016-08-31 MED ORDER — LORAZEPAM 0.5 MG PO TABS
0.5000 mg | ORAL_TABLET | Freq: Every day | ORAL | Status: DC
  Administered 2016-08-31 – 2016-09-03 (×4): 0.5 mg via ORAL
  Filled 2016-08-31 (×4): qty 1

## 2016-08-31 MED ORDER — MEGESTROL ACETATE 40 MG PO TABS
40.0000 mg | ORAL_TABLET | Freq: Every day | ORAL | Status: DC
  Administered 2016-09-02: 40 mg via ORAL
  Filled 2016-08-31 (×2): qty 1

## 2016-08-31 MED ORDER — LORAZEPAM 0.5 MG PO TABS: 0.5000 mg | ORAL_TABLET | Freq: Four times a day (QID) | ORAL | Status: DC | PRN

## 2016-08-31 MED ORDER — AMANTADINE HCL 100 MG PO CAPS
100.0000 mg | ORAL_CAPSULE | Freq: Every day | ORAL | Status: DC
Start: 2016-09-01 — End: 2016-09-04
  Administered 2016-09-01 – 2016-09-03 (×3): 100 mg via ORAL
  Filled 2016-08-31 (×3): qty 1

## 2016-08-31 NOTE — [facility] Group Notes (Signed)
[facility] Group Notes:  (Nursing/MHT/Case Management/Adjunct)  Date:  08/31/2016  Time:  4:27 PM  Type of Therapy:  Psychoeducational Skills  Participation Level:  Did Not Attend  Charise Killian 08/31/2016, 4:27 PM

## 2016-08-31 NOTE — Tx Team (Signed)
Interdisciplinary Treatment and Diagnostic Plan Update  08/31/2016 Time of Session: 10:30am  Haley Arias MRN: [id number]  Principal Diagnosis: Schizophrenia [facility])  Secondary Diagnoses: Principal Problem:   Schizophrenia ([place]) Active Problems:   COPD (chronic obstructive pulmonary disease) (HCC)   Tobacco use disorder   Lung cancer ([place])   Metastasis ([place])   Current Medications:  Current Facility-Administered Medications  Medication Dose Route Frequency Provider Last Rate Last Dose  . acetaminophen (TYLENOL) tablet 650 mg  650 mg Oral Q6H PRN Hildred Priest, MD      . albuterol (PROVENTIL HFA;VENTOLIN HFA) 108 (90 Base) MCG/ACT inhaler 1-2 puff  1-2 puff Inhalation Q4H PRN Hildred Priest, MD      . albuterol (PROVENTIL) (2.5 MG/3ML) 0.083% nebulizer solution 2.5 mg  2.5 mg Nebulization Q6H PRN Hildred Priest, MD      . alum & mag hydroxide-simeth (MAALOX/MYLANTA) 200-200-20 MG/5ML suspension 30 mL  30 mL Oral Q4H PRN Hildred Priest, MD      . Derrill Memo ON 09/01/2016] amantadine (SYMMETREL) capsule 100 mg  100 mg Oral QHS Hildred Priest, MD      . feeding supplement (ENSURE ENLIVE) (ENSURE ENLIVE) liquid 237 mL  237 mL Oral TID BM Hildred Priest, MD   237 mL at 08/31/16 1400  . fluPHENAZine (PROLIXIN) tablet 5 mg  5 mg Oral QHS Hildred Priest, MD   5 mg at 08/30/16 2200  . LORazepam (ATIVAN) tablet 0.5 mg  0.5 mg Oral QHS Hildred Priest, MD      . LORazepam (ATIVAN) tablet 0.5 mg  0.5 mg Oral Q6H PRN Hildred Priest, MD      . magnesium hydroxide (MILK OF MAGNESIA) suspension 30 mL  30 mL Oral Daily PRN Hildred Priest, MD      . megestrol (MEGACE) tablet 40 mg  40 mg Oral Q supper Hildred Priest, MD      . mometasone-formoterol [facility]) 200-5 MCG/ACT inhaler 2 puff  2 puff Inhalation BID Hildred Priest, MD   2 puff at 08/31/16 0815  . multivitamin with  minerals tablet 1 tablet  1 tablet Oral Daily Hildred Priest, MD   1 tablet at 08/31/16 0815  . nicotine (NICODERM CQ - dosed in mg/24 hours) patch 21 mg  21 mg Transdermal Daily Hildred Priest, MD   21 mg at 08/31/16 0816  . predniSONE (DELTASONE) tablet 40 mg  40 mg Oral Q breakfast Hillary Bow, MD   40 mg at 08/31/16 0815   PTA Medications: No prescriptions prior to admission.    Patient Stressors: Medication change or noncompliance  Patient Strengths: Supportive family/friends  Treatment Modalities: Medication Management, Group therapy, Case management,  1 to 1 session with clinician, Psychoeducation, Recreational therapy.   Physician Treatment Plan for Primary Diagnosis: Schizophrenia ([place]) Long Term Goal(s): Improvement in symptoms so as ready for discharge Improvement in symptoms so as ready for discharge   Short Term Goals: Ability to demonstrate self-control will improve Compliance with prescribed medications will improve Ability to identify changes in lifestyle to reduce recurrence of condition will improve Ability to verbalize feelings will improve  Medication Management: Evaluate patient's response, side effects, and tolerance of medication regimen.  Therapeutic Interventions: 1 to 1 sessions, Unit Group sessions and Medication administration.  Evaluation of Outcomes: Progressing  Physician Treatment Plan for Secondary Diagnosis: Principal Problem:   Schizophrenia ([place]) Active Problems:   COPD (chronic obstructive pulmonary disease) ([place])   Tobacco use disorder   Lung cancer ([place])   Metastasis ([place])  Long Term Goal(s):  Improvement in symptoms so as ready for discharge Improvement in symptoms so as ready for discharge   Short Term Goals: Ability to demonstrate self-control will improve Compliance with prescribed medications will improve Ability to identify changes in lifestyle to reduce recurrence of condition will improve Ability to  verbalize feelings will improve     Medication Management: Evaluate patient's response, side effects, and tolerance of medication regimen.  Therapeutic Interventions: 1 to 1 sessions, Unit Group sessions and Medication administration.  Evaluation of Outcomes: Progressing   RN Treatment Plan for Primary Diagnosis: Schizophrenia ([place]) Long Term Goal(s): Knowledge of disease and therapeutic regimen to maintain health will improve  Short Term Goals: Ability to demonstrate self-control, Ability to participate in decision making will improve, Ability to verbalize feelings will improve, Ability to disclose and discuss suicidal ideas, Ability to identify and develop effective coping behaviors will improve and Compliance with prescribed medications will improve  Medication Management: RN will administer medications as ordered by provider, will assess and evaluate patient's response and provide education to patient for prescribed medication. RN will report any adverse and/or side effects to prescribing provider.  Therapeutic Interventions: 1 on 1 counseling sessions, Psychoeducation, Medication administration, Evaluate responses to treatment, Monitor vital signs and CBGs as ordered, Perform/monitor CIWA, COWS, AIMS and Fall Risk screenings as ordered, Perform wound care treatments as ordered.  Evaluation of Outcomes: Progressing   LCSW Treatment Plan for Primary Diagnosis: Schizophrenia ([place]) Long Term Goal(s): Safe transition to appropriate next level of care at discharge, Engage patient in therapeutic group addressing interpersonal concerns.  Short Term Goals: Engage patient in aftercare planning with referrals and resources, Increase social support, Facilitate acceptance of mental health diagnosis and concerns, Facilitate patient progression through stages of change regarding substance use diagnoses and concerns and Increase skills for wellness and recovery  Therapeutic Interventions: Assess for  all discharge needs, 1 to 1 time with Social worker, Explore available resources and support systems, Assess for adequacy in community support network, Educate family and significant other(s) on suicide prevention, Complete Psychosocial Assessment, Interpersonal group therapy.  Evaluation of Outcomes: Progressing    Recreational Therapy Treatment Plan for Primary Diagnosis: Schizophrenia ([place]) Long Term Goal(s): Patient will participate in recreation therapy treatment in at least 2 group sessions without prompting from [place]: Increase communication skills, Increase self-expression skills  Treatment Modalities: Group Therapy and Individual Treatment Sessions  Therapeutic Interventions: Psychoeducation  Evaluation of Outcomes: Progressing   Progress in Treatment: Attending groups: Yes. Participating in groups: Yes. Taking medication as prescribed: Yes. Toleration medication: Yes. Family/Significant other contact made: Yes, individual(s) contacted:    Patient understands diagnosis: Yes. Discussing patient identified problems/goals with staff: Yes. Medical problems stabilized or resolved: Yes. Denies suicidal/homicidal ideation: Yes. Issues/concerns per patient self-inventory: Yes. Other:    New problem(s) identified: Yes, Describe:      New Short Term/Long Term Goal(s):  Discharge Plan or Barriers: Now diagnosed with Stage 4 cancer. Will need placement to go to for additional care and Hospital Palliative referral.  Reason for Continuation of Hospitalization: Medication stabilization  Estimated Length of Stay:2-3 days  Attendees: Patient:Haley Arias 08/31/2016 4:10 PM  Physician: Jerilee Hoh 08/31/2016 4:10 PM  Nursing: Floyde Parkins, RN 08/31/2016 4:10 PM  RN Care Manager: 08/31/2016 4:10 PM  Social Worker: Dossie Arbour, LCSW 08/31/2016 4:10 PM  Recreational Therapist: Drue Flirt, LRT/CTRS 08/31/2016 4:10 PM  Other:  08/31/2016 4:10 PM  Other:  08/31/2016 4:10 PM   Other: 08/31/2016 4:10 PM    Scribe  for Treatment Team: August Saucer, LCSW 08/31/2016 4:10 PM
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
## 2016-08-31 NOTE — Progress Notes (Signed)
Recreation Therapy Notes  Date: 03.12.18 Time: 9:30 am Location: Craft Room   Group Topic: Self-expression   Goal Area(s) Addresses:  Patient will identify one color per emotion listed on wheel. Patient will verbalize benefit of using art as a means of self-expression Patient will verbalize one emotions experienced during session. Patient will be educated on other forms of self-expression.   Behavioral Response: Did not attend   Intervention: Emotion Wheel   Activity: Patients were given an emotion wheel worksheet and were instructed to pick a color for each emotion listed on the wheel.   Education: LRT educated patients other forms of self-expression.   Education Outcome: Patient did not attend group.   Clinical Observations/Feedback: Patient did not attend group.  Leonette Monarch, LRT/CTRS 08/31/2016 10:35 AM

## 2016-08-31 NOTE — Progress Notes (Signed)
D: Pt denies SI/HI/AVH. Pt is pleasant and cooperative. Pt continues to be delusional needing constant redirection, but very appropriate.   A: Pt was offered support and encouragement. Pt was given scheduled medications. Pt was encourage to attend groups. Q 15 minute checks were done for safety.   R:. Pt is taking medication. Pt has no complaints.Pt receptive to treatment and safety maintained on unit.

## 2016-08-31 NOTE — Progress Notes (Signed)
Patient pleasantly confused. Decreased appetite. Isolates to self and room. Denies SI, HI, AVH. Pt noted responding.  Encouragement and support offered. Safety checks maintained. Pt receptive and remains safe on unit with q 15 min checks.

## 2016-08-31 NOTE — Progress Notes (Signed)
Schizophrenia and diagnosis of Stage 4 lung cancer:  Haley Arias [phone])  Is a SW from [employer].  She has known the pt for 15 years and is currently her payee---not her guardian.  Spoke with  Haley Arias [phone]) pt's sister on 08/30/16.  She lives in [place].  Pt also has a brother, Haley Arias, who lives in [place].  Both siblings will be making decisions for the pt, as next of kin. Pt does not have medical/social capacity at this time.  Pt has declined from treatment, she is refusing all the test ordered this weekend.  Does not seen to understand her diagnosis.   Psychiatrically, pt is stable.  We are trying to arrange for d/c to ALF (24/h supervision) this week.    Pt could receive hospice services there.

## 2016-08-31 NOTE — Progress Notes (Signed)
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

## 2016-08-31 NOTE — Plan of Care (Signed)
Problem: Safety: Goal: Ability to remain free from injury will improve Outcome: Progressing Pt safe on the unit at this time

## 2016-08-31 NOTE — BHH Group Notes (Signed)
[facility] Group Notes:  (Nursing/MHT/Case Management/Adjunct)  Date:  08/31/2016  Time:  3:34 AM  Type of Therapy:  Group Therapy  Participation Level:  Did Not Attend    Haley Arias 08/31/2016, 3:34 AM

## 2016-08-31 NOTE — BHH Group Notes (Signed)
[facility] [place] LCSW Group Therapy Note  Date/Time: 08/31/16, 1300  Type of Therapy and Topic:  Group Therapy:  Overcoming Obstacles  Participation Level:  minimal  Description of Group:    In this group patients will be encouraged to explore what they see as obstacles to their own wellness and recovery. They will be guided to discuss their thoughts, feelings, and behaviors related to these obstacles. The group will process together ways to cope with barriers, with attention given to specific choices patients can make. Each patient will be challenged to identify changes they are motivated to make in order to overcome their obstacles. This group will be process-oriented, with patients participating in exploration of their own experiences as well as giving and receiving support and challenge from other group members.  Therapeutic Goals: 1. Patient will identify personal and current obstacles as they relate to admission. 2. Patient will identify barriers that currently interfere with their wellness or overcoming obstacles.  3. Patient will identify feelings, thought process and behaviors related to these barriers. 4. Patient will identify two changes they are willing to make to overcome these obstacles:    Summary of Patient Progress: pt came initially to group but left as soon as CSW finished introducing the topic.      Therapeutic Modalities:   Cognitive Behavioral Therapy Solution Focused Therapy Motivational Interviewing Relapse Prevention Therapy  Lurline Idol, LCSW

## 2016-09-01 DIAGNOSIS — C3431 Malignant neoplasm of lower lobe, right bronchus or lung: Secondary | ICD-10-CM

## 2016-09-01 DIAGNOSIS — Z66 Do not resuscitate: Secondary | ICD-10-CM

## 2016-09-01 DIAGNOSIS — Z515 Encounter for palliative care: Secondary | ICD-10-CM

## 2016-09-01 NOTE — Progress Notes (Signed)
I have tried to reach out to [place] and left her a voicemail as well. I am unable to get in touch with her. Patient does not have decision making capacity and is refusing to undergo staging scans. Pulmonary unable to pursue biopsy and her family- ie her brother and sister would like to involve hospice and keep her comfortable and focus on her quality of life.  Recommend palliative care consult and SW input to ascertain that patients siblings can be her HCP. It would be helpful to have hospice informational meeting with her siblings involved.  I would be happy to discuss this further as outpatient if there is ever consideration for treatment. Unfortunately that would require patient compliance and cooperation. Chemotherapy would cause more harm than benefit and may potentially worsen her QOL at this time as patient does not wish to undergo any tests.   Dr. Randa Evens, MD, MPH [place] [facility] [phone] 09/01/2016 5:10 PM
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
## 2016-09-01 NOTE — Plan of Care (Signed)
Problem: Safety: Goal: Ability to remain free from injury will improve Outcome: Progressing No injury reported or observed   

## 2016-09-01 NOTE — BHH Group Notes (Signed)
Goals Group Date/Time: 09/01/2016 9:00 AM Type of Therapy and Topic: Group Therapy: Goals Group: SMART Goals   Participation Level: Moderate  Description of Group:    The purpose of a daily goals group is to assist and guide patients in setting recovery/wellness-related goals. The objective is to set goals as they relate to the crisis in which they were admitted. Patients will be using SMART goal modalities to set measurable goals. Characteristics of realistic goals will be discussed and patients will be assisted in setting and processing how one will reach their goal. Facilitator will also assist patients in applying interventions and coping skills learned in psycho-education groups to the SMART goal and process how one will achieve defined goal.   Therapeutic Goals:   -Patients will develop and document one goal related to or their crisis in which brought them into treatment.  -Patients will be guided by LCSW using SMART goal setting modality in how to set a measurable, attainable, realistic and time sensitive goal.  -Patients will process barriers in reaching goal.  -Patients will process interventions in how to overcome and successful in reaching goal.   Patient's Goal: Pt attempted to participate in workbook activity.  She shared that her goal is to wash her hair and then left the group after about 5 minutes.   Therapeutic Modalities:  Motivational Interviewing  Art gallery manager  SMART goals setting   Lurline Idol, [place]

## 2016-09-01 NOTE — BHH Group Notes (Signed)
[facility] Group Notes:  (Nursing/MHT/Case Management/Adjunct)  Date:  09/01/2016  Time:  12:00 AM  Type of Therapy:  Psychoeducational Skills  Participation Level:  Active  Participation Quality:  Appropriate  Affect:  Appropriate  Cognitive:  Alert  Insight:  Good  Engagement in Group:  Engaged  Modes of Intervention:  Activity  Summary of Progress/Problems:  Haley Arias 09/01/2016, 12:00 AM

## 2016-09-01 NOTE — Progress Notes (Signed)
I discussed case with Dr Doran Clay regarding patients Large RT lung mass with Mets, patient will be assess for hospice services and that there is very little that Pulmonary can offer at this time, as patient is refusing therapy and assessments.  I have conveyed message to Dr. Duwayne Heck that if she needs any help from [place], she can call us and we will be happy to help.     Corrin Parker, M.D.  [facility]  Medical Director [place] Director [facility] Cardio-Pulmonary Department

## 2016-09-01 NOTE — Plan of Care (Signed)
Problem: Safety: Goal: Ability to remain free from injury will improve Outcome: Not Met (add Reason) Pleasant and cooperative. Bright affect. Med compliant. No PRNs given. meds adm w/ education. No behavior issues noted. No voiced thoughts of hurting herself. Safety maintained with q 15 min checks.

## 2016-09-01 NOTE — Progress Notes (Signed)
Recreation Therapy Notes  Date: 03.13.18 Time: 9:30 am Location: Craft Room  Group Topic: Coping Skills  Goal Area(s) Addresses:  Patient will write at least one healthy coping skill. Patient will verbalize ways to use healthy coping skills.  Behavioral Response: Did not attend  Intervention: Coping Skills Alphabet  Activity: Patients were given a Coping Skills Alphabet worksheet and were instructed to write healthy coping skills for each letter of the alphabet.  Education: LRT educated patients on healthy coping skills.  Education Outcome: Patient did not attend group.  Clinical Observations/Feedback: Patient did not attend group.  Leonette Monarch, LRT/CTRS 09/01/2016 10:15 AM

## 2016-09-01 NOTE — Progress Notes (Signed)
Patient pleasant and cooperative with care. Refuses certain medications. Pt tearful this afternoon stating she wanted to go to [facility] so at least she can exercise. Pt with decreased appetite but drinks extra ensures. Denies SI, HI, AVH. Pt allowed staff Megan, Rn to assist with shower and washing hair. Encouragement and support offered. Safety checks maintained.  Pt receptive and remains safe on unit with q 15 min checks.

## 2016-09-01 NOTE — Progress Notes (Signed)
Patient tearful and upset. C/o back pain. Pain scale 10/10. Tylenol 650 mg po given at 0650 PRN for pain. No further complaints offered. Will continue to monitor behavior.

## 2016-09-01 NOTE — Consult Note (Signed)
Consultation Note Date: 09/01/2016   Patient Name: Haley Arias  DOB: 1950/12/23  MRN: [id number]  Age / Sex: 66 y.o., female  PCP: No Pcp Per Patient Referring Physician: Golden Hurter*  Reason for Consultation: Establishing goals of care, Pain control and Psychosocial/spiritual support  HPI/Patient Profile:   Patient is a 66 yr old female who was transferred to [facility] psychiatry unit from [facility] ER where she presented on involuntary commitment,   Patients SW is [name] and patient was found walking around apartment complex naked and has not been taking care of her ADL's and refusing to taker her medications.   Patients sister Haley Arias also mentioned to Dr. Ledell Noss team that patient has a h/o schizophrenia with multiple hospitalizations in the past and living independently.   She has had a progressive decline and weight loss from 130 to 80lb since December 2017.   On admission here, patient had CXR which showed large RLL lung mass. This was followed by CT chest which showed:  1. 9 cm necrotic right lower lobe mass extends into the right hilum and mediastinum. There is direct tumor invasion of the right inferior pulmonary vein with tumor extending through the left atrium to the level of the mitral valve. There is also evidence of invasion into the right mainstem bronchus with occlusion of the bronchus intermedius. 2. Metastatic lymphadenopathy in the right hilum and mediastinum. 3. Bilateral adrenal metastases. 4. Bony metastasis involving the right fifth rib.  Patient's family (sister/Haley Arias  and brother/Haley Arias) are the two living adult siblings and have an established relationship with the patient who are acting in good faith and can reliably convey the wishes of the patients.   I am trying to get in touch with  SW [name], I left a message but  have not heard back.   Clinical Assessment and Goals of Care:   This NP Wadie Lessen reviewed medical records, received report from team, assessed the patient and then meet at the patient's bedside   to discuss diagnosis ( progressive metastatic cancer), prognosis, GOC, EOL wishes disposition and options.  Patient today is unable to fully participate in the discussion.  I do  Not believe she  has capacity to make her own decisions.   I spoke to Haley Arias her sister/ main decision maker by telephone   A discussion was had today regarding advanced directives.  Concepts specific to code status, artifical feeding and hydration, continued IV antibiotics and rehospitalization was had.  The difference between a aggressive medical intervention path  and a palliative comfort care path for this patient at this time was had.  Values and goals of care important to patient and family were attempted to be elicited.   Concept of Hospice and Palliative Care were discussed  Natural trajectory and expectations at EOL were discussed.  Questions and concerns addressed.   Family encouraged to call with questions or concerns.  PMT will continue to support holistically.    SUMMARY OF RECOMMENDATIONS    Code  Status/Advance Care Planning:  DNR/DNI- focus of care is comfort and dignity    Additional Recommendations (Limitations, Scope, Preferences):  Full comfort  No further  workup for newly diagnosed cancer  No life prolonging measures  Focus of care is comfort and dignity  Disposition to [place] with hospice services in place, dicussed with SW and she will begin search  Psycho-social/Spiritual:   Desire for further Chaplaincy support:yes  Additional Recommendations: Education on hospice services  Prognosis:   Less than three months  Discharge Planning: Hopefully AL with hospice services     Primary Diagnoses: Present on Admission: . Schizophrenia (Arias)   I have reviewed the medical  record, interviewed the patient and family, and examined the patient. The following aspects are pertinent.  Past Medical History:  Diagnosis Date  . COPD (chronic obstructive pulmonary disease) ([place])   . Schizophrenia ([place])   . Tobacco use    Social History   Social History  . Marital status: Married    Spouse name: N/A  . Number of children: N/A  . Years of education: N/A   Social History Main Topics  . Smoking status: Current Every Day Smoker    Packs/day: 1.00    Types: Cigarettes  . Smokeless tobacco: Current User  . Alcohol use None  . Drug use: Unknown  . Sexual activity: Not Asked   Other Topics Concern  . None   Social History Narrative  . None   History reviewed. No pertinent family history. Scheduled Meds: . amantadine  100 mg Oral QHS  . feeding supplement (ENSURE ENLIVE)  237 mL Oral TID BM  . fluPHENAZine  5 mg Oral QHS  . LORazepam  0.5 mg Oral QHS  . megestrol  40 mg Oral Q supper  . mometasone-formoterol  2 puff Inhalation BID  . multivitamin with minerals  1 tablet Oral Daily  . nicotine  21 mg Transdermal Daily   Continuous Infusions: PRN Meds:.acetaminophen, albuterol, albuterol, alum & mag hydroxide-simeth, LORazepam, magnesium hydroxide Medications Prior to Admission:  Prior to Admission medications   Not on File   Allergies  Allergen Reactions  . Cogentin [Benztropine] Anxiety   Review of Systems  Unable to perform ROS: Psychiatric disorder    Physical Exam  Constitutional: She appears cachectic. She appears ill.  HENT:  Mouth/Throat: Abnormal dentition. Dental caries present.  Cardiovascular: Tachycardia present.   Pulmonary/Chest: Effort normal and breath sounds normal.  Skin: Skin is warm and dry.    Vital Signs: BP 103/63 (BP Location: Right Arm)   Pulse 100   Temp 97.7 F (36.5 C) (Oral)   Resp 16   Ht '5\' 1"'$  (1.549 m)   Wt 42.2 kg (93 lb)   SpO2 100%   BMI 17.57 kg/m  Pain Assessment: 0-10   Pain Score: 10-Worst  pain ever   SpO2: SpO2: 100 % O2 Device:SpO2: 100 % O2 Flow Rate: .   IO: Intake/output summary:  Intake/Output Summary (Last 24 hours) at 09/01/16 1005 Last data filed at 09/01/16 0800  Gross per 24 hour  Intake              650 ml  Output                0 ml  Net              650 ml    LBM:   Baseline Weight: Weight: 42.2 kg (93 lb) Most recent weight: Weight: 42.2 kg (93 lb)  Palliative Assessment/Data: 60%   Flowsheet Rows   Flowsheet Row Most Recent Value  Intake Tab  Referral Department  -- [Psychiatry]  Unit at Time of Referral  Other (Comment) [BH]  Palliative Care Primary Diagnosis  Cancer  Date Notified  08/30/16  Palliative Care Type  New Palliative care  Reason for referral  Clarify Goals of Care  Date of Admission  08/28/16  # of days IP prior to Palliative referral  2  Clinical Assessment  Psychosocial & Spiritual Assessment  Palliative Care Outcomes     Discussed with Vernell Morgans and nursing staff  Time In: 1430 Time Out: 1545 Time Total: 75 min Greater than 50%  of this time was spent counseling and coordinating care related to the above assessment and plan.  Signed by: Wadie Lessen, NP   Please contact Palliative Medicine Team phone at [phone] for questions and concerns.  For individual provider: See Shea Evans

## 2016-09-02 DIAGNOSIS — R627 Adult failure to thrive: Secondary | ICD-10-CM

## 2016-09-02 DIAGNOSIS — Z66 Do not resuscitate: Secondary | ICD-10-CM

## 2016-09-02 DIAGNOSIS — Z515 Encounter for palliative care: Secondary | ICD-10-CM

## 2016-09-02 MED ORDER — AMANTADINE HCL 100 MG PO CAPS: 100.0000 mg | ORAL_CAPSULE | Freq: Every day | ORAL | 0 refills | Status: AC

## 2016-09-02 MED ORDER — LORAZEPAM 0.5 MG PO TABS: 0.5000 mg | ORAL_TABLET | Freq: Every day | ORAL | 0 refills | Status: AC

## 2016-09-02 MED ORDER — MOMETASONE FURO-FORMOTEROL FUM 200-5 MCG/ACT IN AERO: 2.0000 | INHALATION_SPRAY | Freq: Two times a day (BID) | RESPIRATORY_TRACT | 0 refills | Status: DC

## 2016-09-02 MED ORDER — ENSURE ENLIVE PO LIQD
237.0000 mL | Freq: Four times a day (QID) | ORAL | Status: DC
  Administered 2016-09-02 – 2016-09-04 (×7): 237 mL via ORAL

## 2016-09-02 MED ORDER — ALBUTEROL SULFATE HFA 108 (90 BASE) MCG/ACT IN AERS: 1.0000 | INHALATION_SPRAY | RESPIRATORY_TRACT | 0 refills | Status: DC | PRN

## 2016-09-02 MED ORDER — ENSURE ENLIVE PO LIQD: 237.0000 mL | Freq: Three times a day (TID) | ORAL | 12 refills | Status: AC

## 2016-09-02 MED ORDER — FLUPHENAZINE DECANOATE 25 MG/ML IJ SOLN
25.0000 mg | Freq: Once | INTRAMUSCULAR | Status: AC
  Administered 2016-09-02: 25 mg via INTRAMUSCULAR
  Filled 2016-09-02: qty 1

## 2016-09-02 MED ORDER — LORAZEPAM 0.5 MG PO TABS
0.5000 mg | ORAL_TABLET | Freq: Three times a day (TID) | ORAL | 0 refills | Status: DC | PRN
Start: 2016-09-02 — End: 2016-09-22

## 2016-09-02 MED ORDER — FLUPHENAZINE HCL 5 MG PO TABS: 5.0000 mg | ORAL_TABLET | Freq: Every day | ORAL | 0 refills | Status: AC

## 2016-09-02 MED ORDER — FLUPHENAZINE DECANOATE 25 MG/ML IJ SOLN: 25.0000 mg | Freq: Once | INTRAMUSCULAR | 1 refills | Status: DC

## 2016-09-02 NOTE — [facility] Group Notes (Signed)
[facility] Group Notes:  (Nursing/MHT/Case Management/Adjunct)  Date:  09/02/2016  Time:  12:37 AM  Type of Therapy:  Psychoeducational Skills  Participation Level:  Did Not Attend  Participation Quality:Summary of Progress/Problems:  [name] 09/02/2016, 12:37 AM

## 2016-09-02 NOTE — Plan of Care (Signed)
Problem: Safety: Goal: Ability to remain free from injury will improve Outcome: Not Met (add Reason) Patient is anxious and disorganized. Med compliant. No PRNs given. meds adm w/education. Pt remains free from harm. Safety maintained with q 15 min checks.

## 2016-09-02 NOTE — [facility] Group Notes (Signed)
  [facility] LCSW Group Therapy Note  Date/Time: 09/02/16, 1300  Type of Therapy/Topic:  Group Therapy:  Emotion Regulation  Participation Level:  Did Not Attend   Mood:  Description of Group:    The purpose of this group is to assist patients in learning to regulate negative emotions and experience positive emotions. Patients will be guided to discuss ways in which they have been vulnerable to their negative emotions. These vulnerabilities will be juxtaposed with experiences of positive emotions or situations, and patients challenged to use positive emotions to combat negative ones. Special emphasis will be placed on coping with negative emotions in conflict situations, and patients will process healthy conflict resolution skills.  Therapeutic Goals: 1. Patient will identify two positive emotions or experiences to reflect on in order to balance out negative emotions:  2. Patient will label two or more emotions that they find the most difficult to experience:  3. Patient will be able to demonstrate positive conflict resolution skills through discussion or role plays:   Summary of Patient Progress:       Therapeutic Modalities:   Cognitive Behavioral Therapy Feelings Identification Dialectical Behavioral Therapy  Lurline Idol, LCSW

## 2016-09-02 NOTE — Progress Notes (Signed)
Daily Progress Note   Patient Name: Haley Arias       Date: 09/02/2016 DOB: [birth year]  Age: 66 y.o. MRN#: [id number] Attending Physician: Golden Hurter* Primary Care Physician: No PCP Per Patient Admit Date: 08/28/2016  Reason for Consultation/Follow-up: Establishing goals of care  Subjective:  - f/u visit to patient and phone calls to pertinent players in her care after initial GOCs meeting yesterday  -patient is alert and cooperative but as documented by psychiatry does not have capacity to make medical decisions  - I have been in contact with her sister/ [name] her main support person and [name] LCSW who has assisted Ms. Baskins over the past 10 years as her payee  - there is no docuemnted HPOA or guardian at this time, I did due diligence and attempted to contact patient's documented husband with no avail.  Ms Thedore Mins tells me they have been estranged for some time and unawre of location at this time  - we discussed completion og MOST form electronically,   Length of Stay: 5  Current Medications: Scheduled Meds:  . amantadine  100 mg Oral QHS  . feeding supplement (ENSURE ENLIVE)  237 mL Oral TID BM  . fluPHENAZine  5 mg Oral QHS  . LORazepam  0.5 mg Oral QHS  . megestrol  40 mg Oral Q supper  . mometasone-formoterol  2 puff Inhalation BID  . multivitamin with minerals  1 tablet Oral Daily  . nicotine  21 mg Transdermal Daily    Continuous Infusions:   PRN Meds: acetaminophen, albuterol, albuterol, alum & mag hydroxide-simeth, LORazepam, magnesium hydroxide  Physical Exam  Constitutional: She appears cachectic. She is cooperative. She appears ill.  Cardiovascular: Tachycardia present.   Pulmonary/Chest: She has decreased breath sounds in the  right lower field.  Musculoskeletal:  generalized weakness and atrophy  Neurological: She is alert.  Skin: Skin is warm and dry.            Vital Signs: BP 93/60 (BP Location: Left Arm)   Pulse (!) 105   Temp 98.4 F (36.9 C) (Oral)   Resp 18   Ht '5\' 1"'$  (1.549 m)   Wt 42.2 kg (93 lb)   SpO2 100%   BMI 17.57 kg/m  SpO2: SpO2: 100 % O2 Device: O2 Device: Not Delivered O2  Flow Rate:    Intake/output summary:  Intake/Output Summary (Last 24 hours) at 09/02/16 0820 Last data filed at 09/01/16 1700  Gross per 24 hour  Intake              120 ml  Output                0 ml  Net              120 ml   LBM:   Baseline Weight: Weight: 42.2 kg (93 lb) Most recent weight: Weight: 42.2 kg (93 lb)       Palliative Assessment/Data: 60 %    Flowsheet Rows   Flowsheet Row Most Recent Value  Intake Tab  Referral Department  -- [Psychiatry]  Unit at Time of Referral  Other (Comment) [BH]  Palliative Care Primary Diagnosis  Cancer  Date Notified  08/30/16  Palliative Care Type  New Palliative care  Reason for referral  Clarify Goals of Care  Date of Admission  08/28/16  Date first seen by Palliative Care  09/01/16  # of days Palliative referral response time  2 Day(s)  # of days IP prior to Palliative referral  2  Clinical Assessment  Psychosocial & Spiritual Assessment  Palliative Care Outcomes      Patient Active Problem List   Diagnosis Date Noted  . Lung cancer ([place])   . Metastasis ([place])   . Schizophrenia ([place]) 08/28/2016  . COPD (chronic obstructive pulmonary disease) ([place]) 08/28/2016  . Tobacco use disorder 08/28/2016    Palliative Care Assessment & Plan   Patient Profile: Patient is a 66 yr old female who was transferred to [facility] psychiat[facility] unit from [facility] ER where she presented on involuntary commitment,   Patients SW is [name] and patient was found walking around apartment complex naked and has not been taking care of her ADL's and refusing to  taker her medications.   Patients sister [name] also mentioned to Dr. Ledell Noss team that patient has a h/o schizophrenia with multiple hospitalizations in the past and living independently.   She has had a progressive decline and weight loss from 130 to 80lb since December 2017.   On admission here, patient had CXR which showed large RLL lung mass. This was followed by CT chest which showed:  1. 9 cm necrotic right lower lobe mass extends into the right hilum and mediastinum. There is direct tumor invasion of the right inferior pulmonary vein with tumor extending through the left atrium to the level of the mitral valve. There is also evidence of invasion into the right mainstem bronchus with occlusion of the bronchus intermedius. 2. Metastatic lymphadenopathy in the right hilum and mediastinum. 3. Bilateral adrenal metastases. 4. Bony metastasis involving the right fifth rib.  Patient's family (sister/Rose Evette Doffing  and brother/Stewart Stann Mainland) are the two living adult siblings and have an established relationship with the patient who are acting in good faith and can reliably convey the wishes of the patients.   Assessment:  Complicated psycho-social situation now impacted by terminal disease, with a prognosis of likely less than 3 months  Recommendations/Plan:  SW to find placement for this patient who cannot safely return to her previous living situation  I have completed a MOST form and sent it to her sister for signature, once received I will place a copy on the chart  Trigger hospice on discharge  Goals of Care and Additional Recommendations:  Limitations on Scope of Treatment: Full Comfort  Care  Code Status:    Code Status Orders        Start     Ordered   08/30/16 1118  Do not attempt resuscitation (DNR)  Continuous    Question Answer Comment  In the event of cardiac or respiratory ARREST Do not call a "code blue"   In the event of cardiac or  respiratory ARREST Do not perform Intubation, CPR, defibrillation or ACLS   In the event of cardiac or respiratory ARREST Use medication by any route, position, wound care, and other measures to relive pain and suffering. May use oxygen, suction and manual treatment of airway obstruction as needed for comfort.      08/30/16 1118    Code Status History    Date Active Date Inactive Code Status Order ID Comments User Context   08/28/2016 12:30 PM 08/30/2016 11:18 AM Full Code [id number]  Hildred Priest, MD Inpatient       Prognosis:   < 3 months  Discharge Planning:  To Be Determined  Care plan was discussed with  Dr Janese Banks, Dr Delsa Bern, Johnnye Lana with [facility]  Thank you for allowing the Palliative Medicine Team to assist in the care of this patient.   Time In: 0725 Time Out: 0800 Total Time 35 min Prolonged Time Billed  no       Greater than 50%  of this time was spent counseling and coordinating care related to the above assessment and plan.  Wadie Lessen, NP  Please contact Palliative Medicine Team phone at [phone] for questions and concerns.

## 2016-09-02 NOTE — Tx Team (Signed)
Interdisciplinary Treatment and Diagnostic Plan Update  08/31/2016 Time of Session: 10:30am Haley Arias MRN: [id number]  Principal Diagnosis: Schizophrenia [facility])  Secondary Diagnoses: Principal Problem:   Schizophrenia ([place]) Active Problems:   COPD (chronic obstructive pulmonary disease) ([place])   Tobacco use disorder   Lung cancer ([place])   Metastasis ([place])   Palliative care by specialist   DNR (do not resuscitate)   Current Medications:  Current Facility-Administered Medications  Medication Dose Route Frequency Provider Last Rate Last Dose  . acetaminophen (TYLENOL) tablet 650 mg  650 mg Oral Q6H PRN Hildred Priest, MD   650 mg at 09/01/16 4709  . albuterol (PROVENTIL HFA;VENTOLIN HFA) 108 (90 Base) MCG/ACT inhaler 1-2 puff  1-2 puff Inhalation Q4H PRN Hildred Priest, MD      . albuterol (PROVENTIL) (2.5 MG/3ML) 0.083% nebulizer solution 2.5 mg  2.5 mg Nebulization Q6H PRN Hildred Priest, MD      . alum & mag hydroxide-simeth (MAALOX/MYLANTA) 200-200-20 MG/5ML suspension 30 mL  30 mL Oral Q4H PRN Hildred Priest, MD      . amantadine (SYMMETREL) capsule 100 mg  100 mg Oral QHS Hildred Priest, MD   100 mg at 09/01/16 2051  . feeding supplement (ENSURE ENLIVE) (ENSURE ENLIVE) liquid 237 mL  237 mL Oral QID Hildred Priest, MD   237 mL at 09/02/16 1700  . fluPHENAZine (PROLIXIN) tablet 5 mg  5 mg Oral QHS Hildred Priest, MD   5 mg at 09/01/16 2051  . LORazepam (ATIVAN) tablet 0.5 mg  0.5 mg Oral QHS Hildred Priest, MD   0.5 mg at 09/01/16 2050  . LORazepam (ATIVAN) tablet 0.5 mg  0.5 mg Oral Q6H PRN Hildred Priest, MD      . magnesium hydroxide (MILK OF MAGNESIA) suspension 30 mL  30 mL Oral Daily PRN Hildred Priest, MD      . megestrol (MEGACE) tablet 40 mg  40 mg Oral Q supper Hildred Priest, MD   40 mg at 09/02/16 1555  . mometasone-formoterol (DULERA) 200-5 MCG/ACT  inhaler 2 puff  2 puff Inhalation BID Hildred Priest, MD   2 puff at 09/01/16 2051  . multivitamin with minerals tablet 1 tablet  1 tablet Oral Daily Hildred Priest, MD   1 tablet at 09/01/16 0750  . nicotine (NICODERM CQ - dosed in mg/24 hours) patch 21 mg  21 mg Transdermal Daily Hildred Priest, MD   21 mg at 09/01/16 0750   PTA Medications: No prescriptions prior to admission.    Patient Stressors: Medication change or noncompliance  Patient Strengths: Supportive family/friends  Treatment Modalities: Medication Management, Group therapy, Case management,  1 to 1 session with clinician, Psychoeducation, Recreational therapy.   Physician Treatment Plan for Primary Diagnosis: Schizophrenia ([place]) Long Term Goal(s): Improvement in symptoms so as ready for discharge Improvement in symptoms so as ready for discharge   Short Term Goals: Ability to demonstrate self-control will improve Compliance with prescribed medications will improve Ability to identify changes in lifestyle to reduce recurrence of condition will improve Ability to verbalize feelings will improve  Medication Management: Evaluate patient's response, side effects, and tolerance of medication regimen.  Therapeutic Interventions: 1 to 1 sessions, Unit Group sessions and Medication administration.  Evaluation of Outcomes: Progressing  Physician Treatment Plan for Secondary Diagnosis: Principal Problem:   Schizophrenia ([place]) Active Problems:   COPD (chronic obstructive pulmonary disease) ([place])   Tobacco use disorder   Lung cancer ([place])   Metastasis ([place])   Palliative care by specialist  DNR (do not resuscitate)  Long Term Goal(s): Improvement in symptoms so as ready for discharge Improvement in symptoms so as ready for discharge   Short Term Goals: Ability to demonstrate self-control will improve Compliance with prescribed medications will improve Ability to identify changes in  lifestyle to reduce recurrence of condition will improve Ability to verbalize feelings will improve     Medication Management: Evaluate patient's response, side effects, and tolerance of medication regimen.  Therapeutic Interventions: 1 to 1 sessions, Unit Group sessions and Medication administration.  Evaluation of Outcomes: Progressing   RN Treatment Plan for Primary Diagnosis: Schizophrenia ([place]) Long Term Goal(s): Knowledge of disease and therapeutic regimen to maintain health will improve  Short Term Goals: Ability to participate in decision making will improve  Medication Management: RN will administer medications as ordered by provider, will assess and evaluate patient's response and provide education to patient for prescribed medication. RN will report any adverse and/or side effects to prescribing provider.  Therapeutic Interventions: 1 on 1 counseling sessions, Psychoeducation, Medication administration, Evaluate responses to treatment, Monitor vital signs and CBGs as ordered, Perform/monitor CIWA, COWS, AIMS and Fall Risk screenings as ordered, Perform wound care treatments as ordered.  Evaluation of Outcomes: Progressing   LCSW Treatment Plan for Primary Diagnosis: Schizophrenia ([place]) Long Term Goal(s): Safe transition to appropriate next level of care at discharge, Engage patient in therapeutic group addressing interpersonal concerns.  Short Term Goals: Engage patient in aftercare planning with referrals and resources, Increase social support, Increase ability to appropriately verbalize feelings, Identify triggers associated with mental health/substance abuse issues and Increase skills for wellness and recovery  Therapeutic Interventions: Assess for all discharge needs, 1 to 1 time with Social worker, Explore available resources and support systems, Assess for adequacy in community support network, Educate family and significant other(s) on suicide prevention, Complete  Psychosocial Assessment, Interpersonal group therapy.  Evaluation of Outcomes: Progressing   Progress in Treatment: Attending groups: Yes. Participating in groups: Yes. Taking medication as prescribed: Yes. Toleration medication: Yes. Family/Significant other contact made: Yes, individual(s) contacted:  sister Patient understands diagnosis: Yes. Discussing patient identified problems/goals with staff: Yes. Medical problems stabilized or resolved: No.Pt stage 4 cancer, given 2 months estimated life Denies suicidal/homicidal ideation: Yes. Issues/concerns per patient self-inventory: No. Other:    New problem(s) identified: No, Describe:     New Short Term/Long Term Goal(s):  Discharge Plan or Barriers: to group home or assisted living and follow up with Hospice care and Outpatient mental health  Reason for Continuation of Hospitalization: Delusions  Hallucinations Medication stabilization Other; describe Stage 4 cancer  Estimated Length of Stay: 2-3 days  Attendees: Patient:Haley Arias 08/31/2016 5:38 PM  Physician: Jerilee Hoh 08/31/2016 5:38 PM  Nursing: Floyde Parkins. RN 08/31/2016 5:38 PM  RN Care Manager: 08/31/2016 5:38 PM  Social Worker: Dossie Arbour, LCSW 3/12/201 5:38 PM  Recreational Therapist: Everitt Amber, LRT 3/12/ 2018 5:38 PM  Other:   5:38 PM  Other:   5:38 PM  Other:  5:38 PM    Scribe for Treatment Team: August Saucer, LCSW 08/31/2016 5:38 PM

## 2016-09-02 NOTE — Progress Notes (Signed)
Patient ID: Haley Arias, female   DOB: 09/19/1950, 66 y.o.   MRN: [id number] Barrington Ellison who runs several group homes met with Pt for interview. CSW left message for Anthoney Harada LCSWA to please follow up onn bed offer status.  Dossie Arbour, MSW, LCSW
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
## 2016-09-02 NOTE — Progress Notes (Signed)
Patient pleasantly confused. Refused medications this am, did take megace for increased appetite. Pt visible in milieu. Denies SI, HI, avh. Encouragement and support offered. Safety checks maintained. Pt receptive and remains safe on unit with q 15 min checks

## 2016-09-02 NOTE — Progress Notes (Signed)
Recreation Therapy Notes  Date: 03.14.18 Time: 9:30 am Location: Craft Room  Group Topic: Self-esteem  Goal Area(s) Addresses:  Patient will write at least one positive trait about self. Patient will verbalize benefit of having healthy self-esteem.  Behavioral Response: Attentive, Left early  Intervention: I Am  Activity: Patients were given a worksheet with the letter I on it and were instructed to write as many positive traits about themselves inside the letter.  Education: LRT educated patients on ways they can increase their self-esteem.  Education Outcome: Patient left before LRT educated group.  Clinical Observations/Feedback: Patient wrote positive traits about herself and drew on her worksheet. Patient left group at approximately 9:45 am and did not return to group.  Leonette Monarch, LRT/CTRS 09/02/2016 10:11 AM

## 2016-09-03 DIAGNOSIS — R627 Adult failure to thrive: Secondary | ICD-10-CM

## 2016-09-03 NOTE — NC FL2 (Signed)
[place] LEVEL OF CARE SCREENING TOOL     IDENTIFICATION  Patient Name: Haley Arias Birthdate: 1951/01/01 Sex: female Admission Date (Current Location): 08/28/2016  [place] and [place] Number:  Selena Lesser [id number] [place] and Address:  [facility], [address] [place], [place] [address]      Provider Number: [id number]  Attending Physician Name and Address:  Golden Hurter*  Relative Name and Phone Number:  Kalman Shan Vincent(sister [phone]    Current Level of Care: Hospital Recommended Level of Care: Family Care Home Prior Approval Number: n/a  Date Approved/Denied:   PASRR Number: n/a  Discharge Plan: Other (Comment) (Group Home)    Current Diagnoses: Patient Active Problem List   Diagnosis Date Noted  . Failure to thrive in adult   . Palliative care by specialist   . DNR (do not resuscitate)   . Lung cancer ([place])   . Metastasis ([place])   . Schizophrenia ([place]) 08/28/2016  . COPD (chronic obstructive pulmonary disease) ([place]) 08/28/2016  . Tobacco use disorder 08/28/2016    Orientation RESPIRATION BLADDER Height & Weight     Self, Place, Time, Situation  Normal Continent Weight: 93 lb (42.2 kg) Height:  '5\' 1"'$  (154.9 cm)  BEHAVIORAL SYMPTOMS/MOOD NEUROLOGICAL BOWEL NUTRITION STATUS   (n/a)  (n/a) Continent  (n/a)  AMBULATORY STATUS COMMUNICATION OF NEEDS Skin   Independent Verbally Normal                       Personal Care Assistance Level of Assistance   (Patient is independent with prompting. )           Functional Limitations Info  Sight Sight Info: Impaired        SPECIAL CARE FACTORS FREQUENCY   (n/a)                    Contractures Contractures Info: Not present    Additional Factors Info  Code Status Code Status Info: DNR- Do not Resitate              Current Medications (09/03/2016):  This is the current hospital active medication list Current  Facility-Administered Medications  Medication Dose Route Frequency Provider Last Rate Last Dose  . acetaminophen (TYLENOL) tablet 650 mg  650 mg Oral Q6H PRN Hildred Priest, MD   650 mg at 09/01/16 3846  . albuterol (PROVENTIL HFA;VENTOLIN HFA) 108 (90 Base) MCG/ACT inhaler 1-2 puff  1-2 puff Inhalation Q4H PRN Hildred Priest, MD      . albuterol (PROVENTIL) (2.5 MG/3ML) 0.083% nebulizer solution 2.5 mg  2.5 mg Nebulization Q6H PRN Hildred Priest, MD      . alum & mag hydroxide-simeth (MAALOX/MYLANTA) 200-200-20 MG/5ML suspension 30 mL  30 mL Oral Q4H PRN Hildred Priest, MD      . amantadine (SYMMETREL) capsule 100 mg  100 mg Oral QHS Hildred Priest, MD   100 mg at 09/02/16 2139  . feeding supplement (ENSURE ENLIVE) (ENSURE ENLIVE) liquid 237 mL  237 mL Oral QID Hildred Priest, MD   237 mL at 09/03/16 0800  . fluPHENAZine (PROLIXIN) tablet 5 mg  5 mg Oral QHS Hildred Priest, MD   5 mg at 09/02/16 2139  . LORazepam (ATIVAN) tablet 0.5 mg  0.5 mg Oral QHS Hildred Priest, MD   0.5 mg at 09/02/16 2139  . LORazepam (ATIVAN) tablet 0.5 mg  0.5 mg Oral Q6H PRN Hildred Priest, MD      . magnesium hydroxide (MILK  OF MAGNESIA) suspension 30 mL  30 mL Oral Daily PRN Hildred Priest, MD      . mometasone-formoterol [facility]) 200-5 MCG/ACT inhaler 2 puff  2 puff Inhalation BID Hildred Priest, MD   2 puff at 09/03/16 1023  . nicotine (NICODERM CQ - dosed in mg/24 hours) patch 21 mg  21 mg Transdermal Daily Hildred Priest, MD   21 mg at 09/01/16 0750     Discharge Medications: Please see discharge summary for a list of discharge medications.  Relevant Imaging Results:  Relevant Lab Results:   Additional [place] Team- Wadie Lessen [phone] or [phone]  Jolaine Click, [place]

## 2016-09-03 NOTE — Progress Notes (Signed)
Patient ID: Haley Arias, female   DOB: 09-05-1950, 66 y.o.   MRN: [id number]  CSW spoke with Haley Arias group home manager [phone]) of [facility]. Patient was accepted to group home and will be picked up tomorrow 09/04/2016 between 12 -1pm. Information for group home is:  [facility] [place], [address]  Group Home Owner: Barrington Ellison Group home manager: Haley Arias [phone]  CSW also spoke with Haley Lessen, NP with [place] Team. Mrs. Haley Arias stated she would place referral to [facility] to coordinate services when patient discharges.   Haley Arias G. Claybon Jabs MSW, [facility] 09/03/2016 4:19 PM

## 2016-09-03 NOTE — Progress Notes (Signed)
Recreation Therapy Notes  At approximately 3:21 pm, LRT attempted treatment session. Patient sleeping and did not wake up when name was called.  Leonette Monarch, LRT/CTRS 09/03/2016 3:40 PM

## 2016-09-03 NOTE — Progress Notes (Signed)
Oriented to person only.  Pleasantly confused.  Presents frequently to nurses station with various requests.  Appetite poor.  Drinks her ensures and calls them her happy juice.  Support and encouragement offered.  Safety checks maintained.

## 2016-09-03 NOTE — Progress Notes (Signed)
Patient ID: Haley Arias, female   DOB: July 21, 1950, 66 y.o.   MRN: [id number]  CSW spoke with Haley Arias [phone]) group home manager of [facility] group [facility]. Stated she would be reaching out to patient's family today and a decision would be made today about a bed offer for patient.   Takeila Thayne G. [place], [facility] 09/03/2016 9:13 AM

## 2016-09-03 NOTE — Plan of Care (Signed)
Problem: Safety: Goal: Ability to remain free from injury will improve Outcome: Not Met (add Reason) Pt is disorganized and confused. Pleasant. Did not attend group. Med compliant. No PRNs given. Safety maintained with q 15 min checks.
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
## 2016-09-03 NOTE — BHH Suicide Risk Assessment (Addendum)
[facility] Discharge Suicide Risk Assessment   Principal Problem: Schizophrenia [facility]) Discharge Diagnoses:  Patient Active Problem List   Diagnosis Date Noted  . Failure to thrive in adult [R62.7]   . Palliative care by specialist [Z51.5]   . DNR (do not resuscitate) [Z66]   . Lung cancer ([place]) [C34.90]   . Metastasis ([place]) [C79.9]   . Schizophrenia ([place]) [F20.9] 08/28/2016  . COPD (chronic obstructive pulmonary disease) ([place]) [J44.9] 08/28/2016  . Tobacco use disorder [F17.200] 08/28/2016      Psychiatric Specialty Exam: ROS  Blood pressure (!) 86/57, pulse (!) 102, temperature 97.7 F (36.5 C), resp. rate 18, height '5\' 1"'$  (1.549 m), weight 42.2 kg (93 lb), SpO2 100 %.Body mass index is 17.57 kg/m.                                                       Mental Status Per Nursing Assessment::   On Admission:     Demographic Factors:  Caucasian  Loss Factors: Decline in physical health  Historical Factors: Impulsivity  Risk Reduction Factors:   Positive social support  Continued Clinical Symptoms:  Previous Psychiatric Diagnoses and Treatments Medical Diagnoses and Treatments/Surgeries  Cognitive Features That Contribute To Risk:  Loss of executive function    Suicide Risk:  Minimal: No identifiable suicidal ideation.  Patients presenting with no risk factors but with morbid ruminations; may be classified as minimal risk based on the severity of the depressive symptoms      Hildred Priest, MD 09/03/2016, 2:03 PM

## 2016-09-03 NOTE — Progress Notes (Signed)
Recreation Therapy Notes  Date: 03.15.18 Time: 9:30 am Location: Craft Room  Group Topic: Leisure Education  Goal Area(s) Addresses:  Patient will identify activities for each letter of the alphabet. Patient will verbalize ability to integrate positive leisure into life post d/c. Patient will verbalize ability to use leisure as a Technical sales engineer.  Behavioral Response: Did not attend  Intervention: Leisure Alphabet  Activity: Patients were given a Leisure Air traffic controller and were instructed to write healthy leisure activities for each letter of the alphabet.  Education: LRT educated patients on what they need to participate in leisure.  Education Outcome: Patient did not attend group.   Clinical Observations/Feedback: Patient did not attend group.  Leonette Monarch, LRT/CTRS 09/03/2016 10:05 AM

## 2016-09-03 NOTE — Progress Notes (Signed)
b/p 86/57, HR 102. Fluids encouraged and accepted. No c/o dizziness or lightheadedness. No s/s of distress noted. No c/o pain/discomfort noted.

## 2016-09-03 NOTE — BHH Group Notes (Signed)
[place] LCSW Group Therapy Note  Date/Time: 09/03/16, 1300  Type of Therapy/Topic:  Group Therapy:  Balance in Life  Participation Level:  none  Description of Group:    This group will address the concept of balance and how it feels and looks when one is unbalanced. Patients will be encouraged to process areas in their lives that are out of balance, and identify reasons for remaining unbalanced. Facilitators will guide patients utilizing problem- solving interventions to address and correct the stressor making their life unbalanced. Understanding and applying boundaries will be explored and addressed for obtaining  and maintaining a balanced life. Patients will be encouraged to explore ways to assertively make their unbalanced needs known to significant others in their lives, using other group members and facilitator for support and feedback.  Therapeutic Goals: 1. Patient will identify two or more emotions or situations they have that consume much of in their lives. 2. Patient will identify signs/triggers that life has become out of balance:  3. Patient will identify two ways to set boundaries in order to achieve balance in their lives:  4. Patient will demonstrate ability to communicate their needs through discussion and/or role plays  Summary of Patient Progress: Pt attended the first few minutes of group and then announced that she "had to go."          Therapeutic Modalities:   Cognitive Behavioral Therapy Solution-Focused Therapy Assertiveness Training  Lurline Idol, LCSW

## 2016-09-03 NOTE — Progress Notes (Signed)
Patient ID: Haley Arias, female   DOB: 1951-05-06, 66 y.o.   MRN: [id number]  CSW spoke with Flo Shanks [phone]), Hospital Liaison for [place] [phone]). Stated she received referral and will be assisting with coordination of patient's Hospice care once she is discharged from hospital. CSW provided name of group home and the contact information for group home manager.   Jibri Schriefer G. Beaver, [facility] 09/03/2016 4:34 PM

## 2016-09-03 NOTE — Progress Notes (Signed)
   Patient ID: Haley Arias, female   DOB: 1950/12/06, 66 y.o.   MRN: [id number]   This NP visited patient at the bedside as a follow up to  yesterday's [place].  She is alert and cooperative but clearly weak and frail.   MOST form was completed yesterday electronically  by sister and a copy is delivered to SW on [place] [facility] to be palced on chart and followed with her on discharge, and  utilization of hospice services was detailed with both SW and Dr Alden Hipp.  Questions and concerns addressed  Time in    0930       Time out    0950  Greater than 50% of the time was spent in counseling and coordination of care  Wadie Lessen NP  Palliative Medicine Team Team Phone # [phone] Pager [phone]

## 2016-09-03 NOTE — Progress Notes (Signed)
New referral for [facility] services at [facility] following discharge received from [facility] following a Palliative medicine consult. Writer received information regarding discharge time and address of [facility] from [facility]. Patient information faxed to referral. Writer to contact patient sister [name] via telephone to discuss hospice services. Full note to follow. Thank you. Flo Shanks RN, BSN, [facility] [place], hospital Liaison [phone] c

## 2016-09-04 NOTE — Tx Team (Signed)
Interdisciplinary Treatment and Diagnostic Plan Update  09/04/2016 Time of Session: 11:00am Haley Arias MRN: [id number]  Principal Diagnosis: Schizophrenia [facility])  Secondary Diagnoses: Principal Problem:   Schizophrenia ([place]) Active Problems:   COPD (chronic obstructive pulmonary disease) ([place])   Tobacco use disorder   Lung cancer ([place])   Metastasis ([place])   Palliative care by specialist   DNR (do not resuscitate)   Failure to thrive in adult   Current Medications:  Current Facility-Administered Medications  Medication Dose Route Frequency Provider Last Rate Last Dose  . acetaminophen (TYLENOL) tablet 650 mg  650 mg Oral Q6H PRN Hildred Priest, MD   650 mg at 09/01/16 7741  . albuterol (PROVENTIL HFA;VENTOLIN HFA) 108 (90 Base) MCG/ACT inhaler 1-2 puff  1-2 puff Inhalation Q4H PRN Hildred Priest, MD      . albuterol (PROVENTIL) (2.5 MG/3ML) 0.083% nebulizer solution 2.5 mg  2.5 mg Nebulization Q6H PRN Hildred Priest, MD      . alum & mag hydroxide-simeth (MAALOX/MYLANTA) 200-200-20 MG/5ML suspension 30 mL  30 mL Oral Q4H PRN Hildred Priest, MD      . amantadine (SYMMETREL) capsule 100 mg  100 mg Oral QHS Hildred Priest, MD   100 mg at 09/03/16 2122  . feeding supplement (ENSURE ENLIVE) (ENSURE ENLIVE) liquid 237 mL  237 mL Oral QID Hildred Priest, MD   237 mL at 09/04/16 0800  . fluPHENAZine (PROLIXIN) tablet 5 mg  5 mg Oral QHS Hildred Priest, MD   5 mg at 09/03/16 2121  . LORazepam (ATIVAN) tablet 0.5 mg  0.5 mg Oral QHS Hildred Priest, MD   0.5 mg at 09/03/16 2123  . LORazepam (ATIVAN) tablet 0.5 mg  0.5 mg Oral Q6H PRN Hildred Priest, MD      . magnesium hydroxide (MILK OF MAGNESIA) suspension 30 mL  30 mL Oral Daily PRN Hildred Priest, MD      . mometasone-formoterol [facility]) 200-5 MCG/ACT inhaler 2 puff  2 puff Inhalation BID Hildred Priest, MD   2 puff at  09/04/16 0905  . nicotine (NICODERM CQ - dosed in mg/24 hours) patch 21 mg  21 mg Transdermal Daily Hildred Priest, MD   21 mg at 09/01/16 0750   PTA Medications: No prescriptions prior to admission.    Patient Stressors: Medication change or noncompliance  Patient Strengths: Supportive family/friends  Treatment Modalities: Medication Management, Group therapy, Case management,  1 to 1 session with clinician, Psychoeducation, Recreational therapy.   Physician Treatment Plan for Primary Diagnosis: Schizophrenia ([place]) Long Term Goal(s): Improvement in symptoms so as ready for discharge Improvement in symptoms so as ready for discharge   Short Term Goals: Ability to demonstrate self-control will improve Compliance with prescribed medications will improve Ability to identify changes in lifestyle to reduce recurrence of condition will improve Ability to verbalize feelings will improve  Medication Management: Evaluate patient's response, side effects, and tolerance of medication regimen.  Therapeutic Interventions: 1 to 1 sessions, Unit Group sessions and Medication administration.  Evaluation of Outcomes: Adequate for Discharge  Physician Treatment Plan for Secondary Diagnosis: Principal Problem:   Schizophrenia ([place]) Active Problems:   COPD (chronic obstructive pulmonary disease) ([place])   Tobacco use disorder   Lung cancer ([place])   Metastasis ([employer])   Palliative care by specialist   DNR (do not resuscitate)   Failure to thrive in adult  Long Term Goal(s): Improvement in symptoms so as ready for discharge Improvement in symptoms so as ready for discharge   Short Term Goals: Ability  to demonstrate self-control will improve Compliance with prescribed medications will improve Ability to identify changes in lifestyle to reduce recurrence of condition will improve Ability to verbalize feelings will improve     Medication Management: Evaluate patient's response, side  effects, and tolerance of medication regimen.  Therapeutic Interventions: 1 to 1 sessions, Unit Group sessions and Medication administration.  Evaluation of Outcomes: Adequate for Discharge   RN Treatment Plan for Primary Diagnosis: Schizophrenia ([place]) Long Term Goal(s): Knowledge of disease and therapeutic regimen to maintain health will improve  Short Term Goals: Ability to participate in decision making will improve  Medication Management: RN will administer medications as ordered by provider, will assess and evaluate patient's response and provide education to patient for prescribed medication. RN will report any adverse and/or side effects to prescribing provider.  Therapeutic Interventions: 1 on 1 counseling sessions, Psychoeducation, Medication administration, Evaluate responses to treatment, Monitor vital signs and CBGs as ordered, Perform/monitor CIWA, COWS, AIMS and Fall Risk screenings as ordered, Perform wound care treatments as ordered.  Evaluation of Outcomes: Adequate for Discharge   LCSW Treatment Plan for Primary Diagnosis: Schizophrenia [facility]) Long Term Goal(s): Safe transition to appropriate next level of care at discharge, Engage patient in therapeutic group addressing interpersonal concerns.  Short Term Goals: Engage patient in aftercare planning with referrals and resources, Increase social support, Increase ability to appropriately verbalize feelings, Identify triggers associated with mental health/substance abuse issues and Increase skills for wellness and recovery  Therapeutic Interventions: Assess for all discharge needs, 1 to 1 time with Social worker, Explore available resources and support systems, Assess for adequacy in community support network, Educate family and significant other(s) on suicide prevention, Complete Psychosocial Assessment, Interpersonal group therapy.  Evaluation of Outcomes: Adequate for Discharge   Progress in Treatment: Attending  groups: Yes. Participating in groups: No. Taking medication as prescribed: Yes. Toleration medication: Yes. Family/Significant other contact made: Yes, individual(s) contacted:  sister Patient understands diagnosis: No. Discussing patient identified problems/goals with staff: Yes. Medical problems stabilized or resolved: Yes. Denies suicidal/homicidal ideation: Yes. Issues/concerns per patient self-inventory: No. Other: n/a  New problem(s) identified: None identified at this time.   New Short Term/Long Term Goal(s): None identified at this time.   Discharge Plan or Barriers: Patient will discharge to a group home and be followed by Hospice care for her stage 4 lung cancer.   Reason for Continuation of Hospitalization: Anticipated discharge 09/04/2016.  Estimated Length of Stay: Anticipated discharge 09/04/2016.   Attendees: Patient: Haley Arias 09/04/2016 1:37 PM  Physician: Dr. Merlyn AlbertPatsey Berthold, MD 09/04/2016 1:37 PM  Nursing: Elige Radon, RN 09/04/2016 1:37 PM  RN Care Manager: 09/04/2016 1:37 PM  Social Worker: Lear Ng. Claybon Jabs MSW, [place] 09/04/2016 1:37 PM  Recreational Therapist: Leonette Monarch, LRT/CTRS 09/04/2016 1:37 PM  Other:  09/04/2016 1:37 PM  Other:  09/04/2016 1:37 PM  Other: 09/04/2016 1:37 PM    Scribe for Treatment Team: Jolaine Click, LCSWA 09/04/2016 1:49 PM

## 2016-09-04 NOTE — Progress Notes (Signed)
  [facility] Adult Case Management Discharge Plan :  Will you be returning to the same living situation after discharge:  No. Patient will be going to a group home and will be followed outpatient by [facility] [place].  At discharge, do you have transportation home?: Yes,  group home staff Do you have the ability to pay for your medications: Yes,  patient has insurance.  Release of information consent forms completed and in the chart;  Patient's signature needed at discharge.  Patient to Follow up at: Follow-up Information    [facility] Follow up on 09/08/2016.   Why:  Follow-up appointment on this date at 1:20pm. Bring photo I.D., all insurance cards, and current medications with you to this appointment.  Contact information: [address], [place] [address] Phone:[phone] Fax:[phone]           Next level of care provider has access to [place] and Suicide Prevention discussed: Yes,  with patient and sister  Have you used any form of tobacco in the last 30 days? (Cigarettes, Smokeless Tobacco, Cigars, and/or Pipes): Yes  Has patient been referred to the Quitline?: Patient refused referral  Patient has been referred for addiction treatment: Yes  Treyten Monestime G. [place], [place] 09/04/2016, 1:35 PM

## 2016-09-04 NOTE — Progress Notes (Signed)
Visit made to new referral for [facility] at [place] [facility] following discharge. Haley Arias is a 66 year old woman with newly diagnosed metastatic lung cancer, admitted to [facility] on 3/9 for [facility] for treatment of her schizophrenia. Per chart note review she was not taking her medications and needed additional supervision. CXR 3/9 on revealed a large lung mass and oncology was consulted. Lung cancer was diagnosed.  No treatment options available. Palliative NP Wadie Lessen was consulted for goals of care and has spoken to patient's  sister Haley Arias who lives in [place], she is agreeable to Hospice services, as is the Group Games developer. Writer met with Haley Arias, a simple explanation of a nurse coming to see her was given, she was pleasant, interactive and agreeable. Plan is for discharge today via Haley Arias, group home to pick her up. Patient information faxed to hospice referral. Signed DNR to accompany patient, CSW Amaris and attending Dr. Jerilee Hoh aware. Thank you for the opportunity to be involved int he care of this patient. Haley Shanks RN, BSN, [facility] [place], hospital Liaison [phone] c
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
## 2016-09-04 NOTE — Progress Notes (Signed)
D: Pt denies SI/HI/AVH. Pt is pleasant and cooperative, affect is bright. Patient is alert to person and place with confusion to situation and time.Patient thoughts are disorganized, tangential speech.  Pt appears less anxious and she is interacting with peers and staff appropriately.  A: Pt was offered support and encouragement. Pt was given scheduled medications. Pt was encouraged to attend groups. Q 15 minute checks were done for safety.  R:Pt attends groups and interacts well with peers and staff. Pt is taking medication. Pt has no complaints.Pt receptive to treatment and safety maintained on unit.

## 2016-09-04 NOTE — Progress Notes (Signed)
Recreation Therapy Notes  Date: 03.16.18 Time: 9:30 am Location: Craft Room  Group Topic: Coping Skills  Goal Area(s) Addresses:  Patient will participate in healthy coping skill activity. Patient will verbalize benefit of using art as a coping skill.  Behavioral Response: Inattentive, Left early  Intervention: Coloring  Activity: Patients were given coloring sheets to color and were instructed to think about the emotions they were feeling and what their minds were focused on.  Education: LRT educated patients on healthy coping skills.  Education Outcome: Patient left before LRT educated group.  Clinical Observations/Feedback: Patient wrote letter on back of coloring sheet she told LRT she wanted to give to the assisted living facility. Patient left group at approximately 9:39 am and did not return to group.  Leonette Monarch, LRT/CTRS 09/04/2016 10:34 AM

## 2016-09-04 NOTE — BHH Group Notes (Signed)
[place] LCSW Group Therapy Note  Date/Time: 09/04/16, 1300  Type of Therapy and Topic:  Group Therapy:  Feelings around Relapse and Recovery  Participation Level:  Did Not Attend   Mood:  Description of Group:    Patients in this group will discuss emotions they experience before and after a relapse. They will process how experiencing these feelings, or avoidance of experiencing them, relates to having a relapse. Facilitator will guide patients to explore emotions they have related to recovery. Patients will be encouraged to process which emotions are more powerful. They will be guided to discuss the emotional reaction significant others in their lives may have to patients' relapse or recovery. Patients will be assisted in exploring ways to respond to the emotions of others without this contributing to a relapse.  Therapeutic Goals: 1. Patient will identify two or more emotions that lead to relapse for them:  2. Patient will identify two emotions that result when they relapse:  3. Patient will identify two emotions related to recovery:  4. Patient will demonstrate ability to communicate their needs through discussion and/or role plays.   Summary of Patient Progress:     Therapeutic Modalities:   Cognitive Behavioral Therapy Solution-Focused Therapy Assertiveness Training Relapse Prevention Therapy  Lurline Idol, LCSW

## 2016-09-04 NOTE — Progress Notes (Signed)
Patient ID: Haley Arias, female   DOB: [birth year], 66 y.o.   MRN: [id number]  CSW made the following contact attempts to inform patient's sister, [name] [phone], of patient's discharge plan, follow-up, and provide contact information for [facility]. No answer, CSW left voicemail providing contact information requesting a returned call.   Date and time of first attempt: 09/04/2016 / 9:04am; left voicemail Date and time of second attempt: 09/04/2016 / 9:59am; left voicemail.   Tamee Battin G. Claybon Jabs MSW, [place] 09/04/2016 10:03 AM

## 2016-09-08 ENCOUNTER — Emergency Department

## 2016-09-08 ENCOUNTER — Inpatient Hospital Stay
Admission: EM | Admit: 2016-09-08 | Discharge: 2016-09-22 | DRG: 871 | Disposition: A | Discharge status: Skilled Nursing Facility | Attending: Internal Medicine | Admitting: Internal Medicine

## 2016-09-08 DIAGNOSIS — Z681 Body mass index (BMI) 19 or less, adult: Secondary | ICD-10-CM | POA: Diagnosis not present

## 2016-09-08 DIAGNOSIS — Z79899 Other long term (current) drug therapy: Secondary | ICD-10-CM

## 2016-09-08 DIAGNOSIS — E860 Dehydration: Secondary | ICD-10-CM | POA: Diagnosis present

## 2016-09-08 DIAGNOSIS — Z590 Homelessness: Secondary | ICD-10-CM | POA: Diagnosis not present

## 2016-09-08 DIAGNOSIS — J9601 Acute respiratory failure with hypoxia: Secondary | ICD-10-CM | POA: Diagnosis present

## 2016-09-08 DIAGNOSIS — E43 Unspecified severe protein-calorie malnutrition: Secondary | ICD-10-CM | POA: Insufficient documentation

## 2016-09-08 DIAGNOSIS — F209 Schizophrenia, unspecified: Secondary | ICD-10-CM | POA: Diagnosis present

## 2016-09-08 DIAGNOSIS — C3431 Malignant neoplasm of lower lobe, right bronchus or lung: Secondary | ICD-10-CM | POA: Diagnosis present

## 2016-09-08 DIAGNOSIS — C7989 Secondary malignant neoplasm of other specified sites: Secondary | ICD-10-CM | POA: Diagnosis present

## 2016-09-08 DIAGNOSIS — Z7902 Long term (current) use of antithrombotics/antiplatelets: Secondary | ICD-10-CM

## 2016-09-08 DIAGNOSIS — F203 Undifferentiated schizophrenia: Secondary | ICD-10-CM

## 2016-09-08 DIAGNOSIS — R6520 Severe sepsis without septic shock: Secondary | ICD-10-CM | POA: Diagnosis present

## 2016-09-08 DIAGNOSIS — Z515 Encounter for palliative care: Secondary | ICD-10-CM | POA: Diagnosis present

## 2016-09-08 DIAGNOSIS — A419 Sepsis, unspecified organism: Secondary | ICD-10-CM | POA: Diagnosis present

## 2016-09-08 DIAGNOSIS — D638 Anemia in other chronic diseases classified elsewhere: Secondary | ICD-10-CM | POA: Diagnosis present

## 2016-09-08 DIAGNOSIS — F17210 Nicotine dependence, cigarettes, uncomplicated: Secondary | ICD-10-CM | POA: Diagnosis present

## 2016-09-08 DIAGNOSIS — J918 Pleural effusion in other conditions classified elsewhere: Secondary | ICD-10-CM | POA: Diagnosis present

## 2016-09-08 DIAGNOSIS — J439 Emphysema, unspecified: Secondary | ICD-10-CM | POA: Diagnosis present

## 2016-09-08 DIAGNOSIS — R0600 Dyspnea, unspecified: Secondary | ICD-10-CM

## 2016-09-08 DIAGNOSIS — Z888 Allergy status to other drugs, medicaments and biological substances status: Secondary | ICD-10-CM | POA: Diagnosis not present

## 2016-09-08 DIAGNOSIS — R627 Adult failure to thrive: Secondary | ICD-10-CM | POA: Diagnosis present

## 2016-09-08 DIAGNOSIS — Y95 Nosocomial condition: Secondary | ICD-10-CM | POA: Diagnosis present

## 2016-09-08 DIAGNOSIS — F200 Paranoid schizophrenia: Secondary | ICD-10-CM | POA: Diagnosis present

## 2016-09-08 DIAGNOSIS — R0902 Hypoxemia: Secondary | ICD-10-CM

## 2016-09-08 DIAGNOSIS — E871 Hypo-osmolality and hyponatremia: Secondary | ICD-10-CM | POA: Diagnosis present

## 2016-09-08 DIAGNOSIS — E039 Hypothyroidism, unspecified: Secondary | ICD-10-CM | POA: Diagnosis present

## 2016-09-08 DIAGNOSIS — R0609 Other forms of dyspnea: Secondary | ICD-10-CM | POA: Diagnosis not present

## 2016-09-08 DIAGNOSIS — C3491 Malignant neoplasm of unspecified part of right bronchus or lung: Secondary | ICD-10-CM | POA: Diagnosis not present

## 2016-09-08 DIAGNOSIS — Z66 Do not resuscitate: Secondary | ICD-10-CM | POA: Diagnosis present

## 2016-09-08 DIAGNOSIS — E876 Hypokalemia: Secondary | ICD-10-CM | POA: Diagnosis present

## 2016-09-08 DIAGNOSIS — J449 Chronic obstructive pulmonary disease, unspecified: Secondary | ICD-10-CM | POA: Diagnosis present

## 2016-09-08 DIAGNOSIS — J189 Pneumonia, unspecified organism: Secondary | ICD-10-CM | POA: Diagnosis present

## 2016-09-08 DIAGNOSIS — Z808 Family history of malignant neoplasm of other organs or systems: Secondary | ICD-10-CM

## 2016-09-08 HISTORY — DX: Malignant (primary) neoplasm, unspecified: C80.1

## 2016-09-08 LAB — CBC
HCT: 31.8 % — ABNORMAL LOW (ref 35.0–47.0)
Hemoglobin: 10.4 g/dL — ABNORMAL LOW (ref 12.0–16.0)
MCH: 24.9 pg — ABNORMAL LOW (ref 26.0–34.0)
MCHC: 32.8 g/dL (ref 32.0–36.0)
MCV: 75.9 fL — AB (ref 80.0–100.0)
PLATELETS: 587 10*3/uL — AB (ref 150–440)
RBC: 4.19 MIL/uL (ref 3.80–5.20)
RDW: 20.8 % — AB (ref 11.5–14.5)
WBC: 22 10*3/uL — AB (ref 3.6–11.0)

## 2016-09-08 LAB — COMPREHENSIVE METABOLIC PANEL
ALBUMIN: 2.7 g/dL — AB (ref 3.5–5.0)
ALK PHOS: 220 U/L — AB (ref 38–126)
ALT: 25 U/L (ref 14–54)
ANION GAP: 12 (ref 5–15)
AST: 22 U/L (ref 15–41)
BILIRUBIN TOTAL: 1.4 mg/dL — AB (ref 0.3–1.2)
BUN: 21 mg/dL — ABNORMAL HIGH (ref 6–20)
CALCIUM: 9.3 mg/dL (ref 8.9–10.3)
CO2: 21 mmol/L — ABNORMAL LOW (ref 22–32)
Chloride: 95 mmol/L — ABNORMAL LOW (ref 101–111)
Creatinine, Ser: 0.83 mg/dL (ref 0.44–1.00)
GFR calc Af Amer: >60 mL/min (ref >60)
GFR calc non Af Amer: >60 mL/min (ref >60)
GLUCOSE: 178 mg/dL — AB (ref 65–99)
Potassium: 3.8 mmol/L (ref 3.5–5.1)
Sodium: 128 mmol/L — ABNORMAL LOW (ref 135–145)
TOTAL PROTEIN: 8.2 g/dL — AB (ref 6.5–8.1)

## 2016-09-08 LAB — LACTIC ACID, PLASMA
LACTIC ACID, VENOUS: 3 mmol/L — AB (ref 0.5–1.9)
Lactic Acid, Venous: 3.2 mmol/L (ref 0.5–1.9)

## 2016-09-08 LAB — TROPONIN I

## 2016-09-08 MED ORDER — FLUPHENAZINE HCL 5 MG PO TABS
5.0000 mg | ORAL_TABLET | Freq: Every day | ORAL | Status: DC
  Administered 2016-09-08: 5 mg via ORAL
  Filled 2016-09-08: qty 1

## 2016-09-08 MED ORDER — LORAZEPAM 2 MG/ML IJ SOLN
INTRAMUSCULAR | Status: AC
  Filled 2016-09-08: qty 1

## 2016-09-08 MED ORDER — CEFEPIME-DEXTROSE 1 GM/50ML IV SOLR
1.0000 g | Freq: Once | INTRAVENOUS | Status: DC
  Filled 2016-09-08: qty 50

## 2016-09-08 MED ORDER — MORPHINE SULFATE 10 MG/5ML PO SOLN: 10.0000 mg | ORAL | Status: DC | PRN

## 2016-09-08 MED ORDER — SODIUM CHLORIDE 0.9 % IV BOLUS (SEPSIS): 1000.0000 mL | Freq: Once | INTRAVENOUS | Status: DC

## 2016-09-08 MED ORDER — LEVOFLOXACIN 500 MG PO TABS
500.0000 mg | ORAL_TABLET | Freq: Once | ORAL | Status: AC
  Administered 2016-09-08: 500 mg via ORAL
  Filled 2016-09-08: qty 1

## 2016-09-08 MED ORDER — LEVOFLOXACIN IN D5W 500 MG/100ML IV SOLN: 500.0000 mg | Freq: Once | INTRAVENOUS | Status: DC

## 2016-09-08 MED ORDER — IPRATROPIUM-ALBUTEROL 0.5-2.5 (3) MG/3ML IN SOLN: 3.0000 mL | Freq: Once | RESPIRATORY_TRACT | Status: DC

## 2016-09-08 MED ORDER — HALOPERIDOL LACTATE 5 MG/ML IJ SOLN: 5.0000 mg | Freq: Once | INTRAMUSCULAR | Status: DC

## 2016-09-08 MED ORDER — VANCOMYCIN HCL 10 G IV SOLR
1500.0000 mg | Freq: Once | INTRAVENOUS | Status: DC
  Filled 2016-09-08: qty 1500

## 2016-09-08 MED ORDER — LORAZEPAM 2 MG/ML IJ SOLN: 2.0000 mg | Freq: Once | INTRAMUSCULAR | Status: DC

## 2016-09-08 MED ORDER — LORAZEPAM 0.5 MG PO TABS
0.5000 mg | ORAL_TABLET | Freq: Every day | ORAL | Status: DC
  Administered 2016-09-08: 0.5 mg via ORAL
  Filled 2016-09-08: qty 1

## 2016-09-08 MED ORDER — METHYLPREDNISOLONE SODIUM SUCC 125 MG IJ SOLR
125.0000 mg | Freq: Once | INTRAMUSCULAR | Status: AC
  Administered 2016-09-08: 125 mg via INTRAVENOUS
  Filled 2016-09-08: qty 2

## 2016-09-08 MED ORDER — HALOPERIDOL LACTATE 5 MG/ML IJ SOLN
INTRAMUSCULAR | Status: AC
  Filled 2016-09-08: qty 2

## 2016-09-08 MED ORDER — ALBUTEROL SULFATE HFA 108 (90 BASE) MCG/ACT IN AERS
2.0000 | INHALATION_SPRAY | RESPIRATORY_TRACT | Status: DC | PRN
Start: 2016-09-08 — End: 2016-09-08
  Filled 2016-09-08: qty 6.7

## 2016-09-08 MED ORDER — AMANTADINE HCL 100 MG PO CAPS
100.0000 mg | ORAL_CAPSULE | Freq: Every day | ORAL | Status: DC
  Administered 2016-09-08: 100 mg via ORAL
  Filled 2016-09-08: qty 1

## 2016-09-08 MED ORDER — SODIUM CHLORIDE 0.9 % IV BOLUS (SEPSIS): 500.0000 mL | Freq: Once | INTRAVENOUS | Status: DC

## 2016-09-08 MED ORDER — SODIUM CHLORIDE 0.9 % IV BOLUS (SEPSIS)
1000.0000 mL | Freq: Once | INTRAVENOUS | Status: AC
  Administered 2016-09-08: 1000 mL via INTRAVENOUS

## 2016-09-08 MED ORDER — MOMETASONE FURO-FORMOTEROL FUM 200-5 MCG/ACT IN AERO: 2.0000 | INHALATION_SPRAY | Freq: Two times a day (BID) | RESPIRATORY_TRACT | Status: DC

## 2016-09-08 MED ORDER — ALBUTEROL SULFATE (2.5 MG/3ML) 0.083% IN NEBU
5.0000 mg | INHALATION_SOLUTION | Freq: Once | RESPIRATORY_TRACT | Status: AC
  Administered 2016-09-08: 5 mg via RESPIRATORY_TRACT
  Filled 2016-09-08: qty 6

## 2016-09-08 MED ORDER — METHYLPREDNISOLONE SODIUM SUCC 125 MG IJ SOLR: 60.0000 mg | Freq: Four times a day (QID) | INTRAMUSCULAR | Status: DC

## 2016-09-08 NOTE — ED Notes (Signed)
When pt gets upset her breathing becomes faster, pt was standing and yelling and pt started to bend over. Pt helped back to recliner.

## 2016-09-08 NOTE — ED Notes (Signed)
Pt states SOB since going back to group home. Pt was DC recently and went back to group home. Pt talking in complete sentences, when pt talks oxygen levels go down. Pt being placed on bipap from nonrebreather

## 2016-09-08 NOTE — ED Notes (Signed)
Haley Arias talked to Haley Arias. [facility] ED medic still at bedside.   Haley Arias asked to use Haley Arias ascom to call Haley Arias. Haley Arias told Haley Arias that Haley Arias are still married but he is with another woman. Haley Arias stated to Haley Arias that Haley Arias told Haley Arias that when she got out of hospital that she could come home to him. Haley Arias has the right to use the phone, andy still at bedside.   Will continue to monitor.
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
## 2016-09-08 NOTE — ED Notes (Signed)
Pt shouting that she wants to go sit in the lobby. Pt standing in door.   [place], social worker- pt asking to talk to her. Pt informed that a behavioral nurse will come talk to her.

## 2016-09-08 NOTE — ED Notes (Signed)
Pt continuing to talk in complete sentences, color not changing. Pt still breathing fast.

## 2016-09-08 NOTE — ED Notes (Signed)
Pt ambulatory to and from bathroom with steady gait

## 2016-09-08 NOTE — ED Notes (Signed)
[place] in rm to speak with pt at this time.

## 2016-09-08 NOTE — ED Notes (Signed)
Tracey EDT at bedside as a Actuary. Pt has been calm and cooperative, has not tried to leave.

## 2016-09-08 NOTE — ED Notes (Signed)
Pt ambulatory to toilet with steady gait.

## 2016-09-08 NOTE — ED Notes (Signed)
Pt provided decaf coffee.

## 2016-09-08 NOTE — ED Notes (Signed)
Pt has COPD, lung ca, metastisized to heart per friend in room. Pt has DNR/DNI papers but not in ED currently. Pt stating she would want to be intubated.

## 2016-09-08 NOTE — ED Triage Notes (Signed)
Pt was 78% on RA, placed on nasal cannula and was at 63%. , pt appears pale, pt is a hospice pt. Pt placed on non-rebreather and came to 100%.

## 2016-09-08 NOTE — BH Assessment (Signed)
Assessment Note  Haley Arias is an 66 y.o. female who presents to the ER from her Group Home due to medical concerns. While in the ER the patient became anxious and agitated. Get got to the point she required a one to one sitter. According the patient, she lives in a group home and do not like it. She states, the group home staff and residents are doing "[employer] and they are vexing my spirit."  She further reports she want to be admitted Behavioral Medicine because the staff was nice. She was a patient on the behavioral health unit, approximately a week ago.  Per her sister Kalman Shan [phone]), patient was brought to the ER due to shortness of breath.  This morning, history Paranoid Schizophrenia. She's "been taking Prolixin every other week for years." Now that she's been diagnosed with cancer, she's having more symptoms of psychosis. "I think she's not taking her medicine." Sister also reports, her initial phone call from the hospital was to inform her the patient could not breathe and that "she (patient) might die within the next few hours. Next thing I know, they calling me back, saying she's out of control and they had to hold her down..."  During the interview, the patient was calm, cooperative and pleasant. She was able to answer the questions with appropriate answers. She denies SI/HI and AV/H.   Diagnosis: Schizophrenia   Past Medical History:  Past Medical History:  Diagnosis Date  . Cancer ([place])   . COPD (chronic obstructive pulmonary disease) ([place])   . Schizophrenia ([place])   . Tobacco use     History reviewed. No pertinent surgical history.  Family History: History reviewed. No pertinent family history.  Social History:  reports that she has been smoking Cigarettes.  She has been smoking about 1.00 pack per day. She uses smokeless tobacco. Her alcohol and drug histories are not on file.  Additional Social History:  Alcohol / Drug Use Pain Medications: See  PTA Prescriptions: See PTA Over the Counter: See PTA History of alcohol / drug use?: No history of alcohol / drug abuse Longest period of sobriety (when/how long): n/a Negative Consequences of Use:  (n/a) Withdrawal Symptoms:  (n/a)  CIWA: CIWA-Ar BP: (!) 110/93 Pulse Rate: (!) 25 COWS:    Allergies:  Allergies  Allergen Reactions  . Cogentin [Benztropine] Anxiety    Home Medications:  (Not in a hospital admission)  OB/GYN Status:  No LMP recorded.  General Assessment Data Location of Assessment: [facility] ED TTS Assessment: In system Is this a Tele or Face-to-Face Assessment?: Face-to-Face Is this an Initial Assessment or a Re-assessment for this encounter?: Initial Assessment Marital status: Married [place] name: n/a Is patient pregnant?: No Pregnancy Status: No Living Arrangements: Group Home Can pt return to current living arrangement?: Yes Admission Status: Voluntary Is patient capable of signing voluntary admission?: No Referral Source: Self/Family/Friend Insurance type: [facility] MCR  Medical Screening Exam ([place]) Medical Exam completed: Yes  Crisis Care Plan Living Arrangements: Group Home Legal Guardian: Other: (Self) Name of Psychiatrist: Reports of None Name of Therapist: Reports of None  Education Status Is patient currently in school?: No Current Grade: n/a Highest grade of school patient has completed: GED Name of school: n/a Contact person: n/a  Risk to self with the past 6 months Suicidal Ideation: No Has patient been a risk to self within the past 6 months prior to admission? : No Suicidal Intent: No Has patient had any suicidal intent within  the past 6 months prior to admission? : No Is patient at risk for suicide?: No Suicidal Plan?: No Has patient had any suicidal plan within the past 6 months prior to admission? : No Access to Means: No What has been your use of drugs/alcohol within the last 12 months?: Reports of none Previous  Attempts/Gestures: No How many times?: 0 Other Self Harm Risks: Reports of none Triggers for Past Attempts: None known Intentional Self Injurious Behavior: None Family Suicide History: No Recent stressful life event(s): Recent negative physical changes Persecutory voices/beliefs?: No Depression: Yes Depression Symptoms: Tearfulness, Guilt, Loss of interest in usual pleasures, Isolating, Feeling worthless/self pity Substance abuse history and/or treatment for substance abuse?: No Suicide prevention information given to non-admitted patients: Not applicable  Risk to Others within the past 6 months Homicidal Ideation: No Does patient have any lifetime risk of violence toward others beyond the six months prior to admission? : No Thoughts of Harm to Others: No Current Homicidal Intent: No Current Homicidal Plan: No Access to Homicidal Means: No Identified Victim: Reports of none History of harm to others?: No Assessment of Violence: None Noted Violent Behavior Description: Reports of none Does patient have access to weapons?: No Does patient have a court date: No Is patient on probation?: No  Psychosis Hallucinations: None noted Delusions: None noted  Mental Status Report Appearance/Hygiene: Unremarkable, In scrubs Eye Contact: Fair Motor Activity: Unremarkable Speech: Tangential, Soft, Slow, Unremarkable Level of Consciousness: Alert Mood: Depressed, Anxious, Sad, Pleasant Affect: Depressed, Sad, Anxious Anxiety Level: Minimal Thought Processes: Irrelevant, Tangential Judgement: Impaired Orientation: Person, Place, Time, Situation, Appropriate for developmental age Obsessive Compulsive Thoughts/Behaviors: Minimal  Cognitive Functioning Concentration: Decreased Memory: Recent Intact, Remote Impaired IQ: Average Insight: Fair Impulse Control: Fair Appetite: Fair Weight Loss: 30 (Within a few months, per previous assessment) Weight Gain: 0 Sleep: No Change Total Hours  of Sleep: 8 Vegetative Symptoms: None  ADLScreening [facility] Assessment Services) Patient's cognitive ability adequate to safely complete daily activities?: Yes Patient able to express need for assistance with ADLs?: Yes Independently performs ADLs?: Yes (appropriate for developmental age)  Prior Inpatient Therapy Prior Inpatient Therapy: Yes Prior Therapy Dates: 08/2016, 05/2002, 06/2001, 12/2000 & 09/2000 Prior Therapy Facilty/Provider(s): [facility], "Dorethea Dix." Reason for Treatment: Schizophrenia  Prior Outpatient Therapy Prior Outpatient Therapy: Yes Prior Therapy Dates: Current Prior Therapy Facilty/Provider(s): [facility] Reason for Treatment: Schizophrenia Does patient have an ACCT team?: No Does patient have [facility] services? : No Does patient have P4CC services?: No  ADL Screening (condition at time of admission) Patient's cognitive ability adequate to safely complete daily activities?: Yes Is the patient deaf or have difficulty hearing?: No Does the patient have difficulty seeing, even when wearing glasses/contacts?: No Does the patient have difficulty concentrating, remembering, or making decisions?: No Patient able to express need for assistance with ADLs?: Yes Does the patient have difficulty dressing or bathing?: No Independently performs ADLs?: Yes (appropriate for developmental age) Does the patient have difficulty walking or climbing stairs?: No Weakness of Legs: None Weakness of Arms/Hands: None  Home Assistive Devices/Equipment Home Assistive Devices/Equipment: None  Therapy Consults (therapy consults require a physician order) PT Evaluation Needed: No OT Evalulation Needed: No SLP Evaluation Needed: No Abuse/Neglect Assessment (Assessment to be complete while patient is alone) Physical Abuse: Denies Verbal Abuse: Denies Sexual Abuse: Denies Exploitation of patient/patient's resources: Denies Self-Neglect: Denies Values /  Beliefs Cultural Requests During Hospitalization: None Spiritual Requests During Hospitalization: None Consults Spiritual Care Consult Needed: No Social Work  Consult Needed: No Advance Directives (For Healthcare) Does Patient Have a Medical Advance Directive?: Yes Type of Advance Directive: Out of facility DNR (pink MOST or yellow form)    Additional Information 1:1 In Past 12 Months?: No CIRT Risk: No Elopement Risk: No Does patient have medical clearance?: Yes  Child/Adolescent Assessment Running Away Risk: Denies (Patient is an adult)  Disposition:  Disposition Initial Assessment Completed for this Encounter: Yes Disposition of Patient: Other dispositions (ER MD Ordered Psych Consult)  On Site Evaluation by:   Reviewed with Physician:    Gunnar Fusi MS, LCAS, LPC, [place], CCSI Therapeutic Triage Specialist 09/08/2016 3:24 PM

## 2016-09-08 NOTE — ED Notes (Signed)
Pt refused to get back on to stretcher, pt refused to have pulse ox on her again. Pt given blue recliner chair. Agreed to sit in it. Pt asking hospice RN if she is from behavioral. Informed she is not and is now refusing to talk.

## 2016-09-08 NOTE — ED Notes (Signed)
Pt sitting in recliner having a conversation with Jonni Sanger ED Runner, broadcasting/film/video

## 2016-09-08 NOTE — ED Notes (Signed)
Pt talking about her husband who lives in [place] according to pt. Pt states her husband gave her the orange jacket she is wearing. Pt had previously asked this rn to zip jacket up. Pt denies wanting to watch tv because her glasses are broke in [place]. Pt remaining sitting in recliner, staying calm.   Jonni Sanger ED medic, this Rn and Santiago Glad, hospice Rn still at bedside.

## 2016-09-08 NOTE — ED Notes (Signed)
After a walk to the restroom of about 40 ft pt's pulse ox decreased to 90%. Note that it was 96% earlier while at rest.

## 2016-09-08 NOTE — ED Notes (Signed)
Pt calm at this time, awaiting tts. Jonni Sanger ed medic still at bedside. Pt sitting in recliner. Breathing easier while sitting. Still will not allow pulse ox in finger or ear, and will not allow oxygen to be placed on pt.

## 2016-09-08 NOTE — ED Provider Notes (Signed)
After all lab results were obtained, reviewing the entire clinical picture does show that the patient is tachycardic with a very high white blood cell count and pneumonia. Elevated lactate as well. This is consistent with severe sepsis. We'll ensure the patient receives a 30 mL/kg bolus. IV Levaquin. Case discussed with hospitalist for admission.  Final diagnoses:  Primary malignant neoplasm of right lung metastatic to other site [facility])  Hypoxia  Schizophrenia, unspecified type ([place])  Community acquired pneumonia, unspecified laterality  Sepsis, due to unspecified organism [facility])      Carrie Mew, MD 09/08/16 2309

## 2016-09-08 NOTE — Progress Notes (Signed)
ED visit made. Patient is currently followed by [facility] [place], group home with a  Hospice diagnosis of Lung Cancer. She has an out of facility DNR in place at the group home as well as MOST form. Both were sent with her at discharge on 3/16 from [facility]. She was brought to the [facility] ED today the group home staff for evaluation of disorientation and shortness of breath, she was found to have very low oxygen saturations requiring Bipap, IV steroids and a breathing treatment. Writer was notified that she was in the ED by [facility] Beau Fanny. At the time of writers visit attending physician felt that patient was at end of life, this was discussed with her sister Kalman Shan. There was discussion regarding transport to the Hospice home for symptom management and EOL care. Patient seen sitting up in the recliner, upset, wanting to leave, had taken off her bipap, removed her IV and had gotten dressed. She was briefly agitated, but settled with calm interaction from ED [facility]. Patient is not appropriate at this time for transfer to the hospice home. Writer spoke on the phone to patient's brother Milta Deiters, who expressed understanding. He does not feel that she will return to the group home and may need a more secure setting. Writer spoke again to Attending physician Dr. Alfred Levins and [facility] Malachy Mood  to relay this information. Patient remains in the ED. Hospice team updated. Will continue to follow and update hospice team. Thank you. Flo Shanks RN, BSN, [place] [facility] [place], [facility] [phone] c

## 2016-09-08 NOTE — ED Notes (Addendum)
Pt changed into purple scrubs by this RN. Orange jacket, colorful sun dress, blue slippers, white socks, and black purse placed in pt belongings bag. Pt already had on correct underwear.

## 2016-09-08 NOTE — ED Notes (Signed)
Pt was given the chance to call her husband, she called [phone]) and he did not answer.   She left him a message stating the following... (to summarize)... I am ok, they checked me out at the hospital and I'm ok to leave so could you come pick me up. I'm ready to leave, I don't want to stay here and I'm not going back to that group home. I know you're having an affair with ...?..Marland Kitchen And you can keep having the affair, I don't care, just come pick me up now OK. I want to come stay with you and I'm ready to leave.   RN Anda Kraft advised of same after call ended.

## 2016-09-08 NOTE — ED Notes (Signed)
Pt crying at this moment. When asked what she was crying about pt stated "Im crying about having a good time." Pt then proceeds to talk about what she talked to the doctor about.

## 2016-09-08 NOTE — ED Notes (Signed)
Pt provided a cup of decaf coffee. Pt sitting in recliner. Tracey EDT at bedside
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
## 2016-09-08 NOTE — Care Management Note (Signed)
Case Management Note  Patient Details  Name: Haley Arias MRN: [id number] Date of Birth: [birth year]  Subjective/Objective:    Verified through Flo Shanks, that the pt. Just recently was opened to [facility]. She is a DNR per them as well as previous notes. The patient today however, is asking for intubation and CPR if needed. Dr Alfred Levins spoke with karen by phone. Patient is currently on BIPAP.                Action/Plan:   Expected Discharge Date:                  Expected Discharge Plan:     In-House Referral:     Discharge planning Services     Post Acute Care Choice:    Choice offered to:     DME Arranged:    DME Agency:     HH Arranged:    HH Agency:     Status of Service:     If discussed at [employer] of Stay Meetings, dates discussed:    Additional Comments:  Beau Fanny, RN 09/08/2016, 12:01 PM

## 2016-09-08 NOTE — ED Notes (Addendum)
Pt provided [place] sandwich tray and coke to drink. Jill RN fixing sandwich with lettuce, tomato, and some mayo for pt. Pt eating chips.   Jonni Sanger, ed medic at bedside to sit with pt.

## 2016-09-08 NOTE — ED Notes (Signed)
Pt spoke with her brother by phone, Milta Deiters [phone]), who advised her that he is not willing to come pick her up until he talks directly with the ER DR to come up with a plan. Pt was mildly upset after phone call, but settled down.
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
## 2016-09-08 NOTE — ED Notes (Signed)
Pt remaining calm at this time, asking about behavioral. Informed that they are going to talk to her. Pt sitting in recliner with tissues, coughing into them. Lunch tray beside her.

## 2016-09-08 NOTE — Consult Note (Signed)
[facility] Face-to-Face Psychiatry Consult   Reason for Consult:  Consult for 66 year old woman with a history of schizophrenia who presents voluntarily to the emergency room with a chief complaint of shortness of breath Referring Physician:  Eual Fines Patient Identification: Haley Arias MRN:  [id number] Principal Diagnosis: Schizophrenia [facility]) Diagnosis:   Patient Active Problem List   Diagnosis Date Noted  . Failure to thrive in adult [R62.7]   . Palliative care by specialist [Z51.5]   . DNR (do not resuscitate) [Z66]   . Lung cancer ([place]) [C34.90]   . Metastasis ([place]) [C79.9]   . Schizophrenia ([place]) [F20.9] 08/28/2016  . COPD (chronic obstructive pulmonary disease) ([place]) [J44.9] 08/28/2016  . Tobacco use disorder [F17.200] 08/28/2016    Total Time spent with patient: 1 hour  Subjective:   Haley Arias is a 66 y.o. female patient admitted with "I'm not going back to that group home".  HPI:  Patient interviewed. Chart reviewed. Case reviewed with TTS and emergency room physician. 66 year old woman with a long history of schizophrenia. She is from the [place] area but was admitted to the psychiatry ward at our facility earlier in the month. She was just discharged about 4 days ago to a group home locally. Patient returns today stating that she refuses to go back to the group home because they've practice "black magic". When I ask her to describe what they've actually done to hurt her she gets disorganized and can't actually formulate a specific complaint. At time she appears to be complaining about the ethnicity of the other residents but when I challenged her on this she denied it. Patient says she has remained compliant with her medicine. Denies hallucinations. She says she is sleeping fine. She admits she has shortness of breath but says that is a chronic problem that is easily managed. Patient expresses no memory of having been told she has lung cancer. When I reminded her of it she said  that she did not believe in it. She does say that she likes to take Prolixin. There is no evidence of any actual acute dangerousness to self or others right now. Patient is disorganized and paranoid which is most likely her chronic baseline.  Social history: Patient evidently had previously been living independently in the [place] area before she was brought into [facility] and then transferred to [place]. From what I can piece together, she is still legally married but she and her husband and had nothing to do with each other for years and it's unclear if he is even aware where she is. She has a brother and sister who have been mentioned in some of the notes. I don't think they've been in today. They seem to of been involved in making some of the decisions during the last hospital stay but they are not her legal guardian.  Medical history: During her recent hospitalization patient was diagnosed with advanced metastatic lung cancer. Palliative care was recommended. She also has COPD.  Substance abuse history: Denies any history of alcohol or drug abuse  Past Psychiatric History: Patient has had several hospitalizations throughout her life including extended hospitalizations [facility] the state facility when she was younger. She denies ever having tried to kill her self or ever being violent. She is apparently been treated with Prolixin and Prolixin Decanoate for years.  Risk to Self: Suicidal Ideation: No Suicidal Intent: No Is patient at risk for suicide?: No Suicidal Plan?: No Access to Means: No What has been your use of  drugs/alcohol within the last 12 months?: Reports of none How many times?: 0 Other Self Harm Risks: Reports of none Triggers for Past Attempts: None known Intentional Self Injurious Behavior: None Risk to Others: Homicidal Ideation: No Thoughts of Harm to Others: No Current Homicidal Intent: No Current Homicidal Plan: No Access to Homicidal Means: No Identified Victim:  Reports of none History of harm to others?: No Assessment of Violence: None Noted Violent Behavior Description: Reports of none Does patient have access to weapons?: No Does patient have a court date: No Prior Inpatient Therapy: Prior Inpatient Therapy: Yes Prior Therapy Dates: 08/2016, 05/2002, 06/2001, 12/2000 & 09/2000 Prior Therapy Facilty/Provider(s): [facility], [facility] Reason for Treatment: Schizophrenia Prior Outpatient Therapy: Prior Outpatient Therapy: Yes Prior Therapy Dates: Current Prior Therapy Facilty/Provider(s): [facility] Reason for Treatment: Schizophrenia Does patient have an ACCT team?: No Does patient have [facility] services? : No Does patient have [facility] services?: No  Past Medical History:  Past Medical History:  Diagnosis Date  . Cancer ([place])   . COPD (chronic obstructive pulmonary disease) ([place])   . Schizophrenia ([place])   . Tobacco use    History reviewed. No pertinent surgical history. Family History: History reviewed. No pertinent family history. Family Psychiatric  History: Does not know of any family history Social History:  History  Alcohol use Not on file     History  Drug use: Unknown    Social History   Social History  . Marital status: Married    Spouse name: N/A  . Number of children: N/A  . Years of education: N/A   Social History Main Topics  . Smoking status: Current Every Day Smoker    Packs/day: 1.00    Types: Cigarettes  . Smokeless tobacco: Current User  . Alcohol use None  . Drug use: Unknown  . Sexual activity: Not Asked   Other Topics Concern  . None   Social History Narrative  . None   Additional Social History:    Allergies:   Allergies  Allergen Reactions  . Cogentin [Benztropine] Anxiety    Labs:  Results for orders placed or performed during the hospital encounter of 09/08/16 (from the past 48 hour(s))  Troponin I     Status: None   Collection Time: 09/08/16 11:46 AM  Result  Value Ref Range   Troponin I <0.03 <0.03 ng/mL  CBC     Status: Abnormal   Collection Time: 09/08/16 11:46 AM  Result Value Ref Range   WBC 22.0 (H) 3.6 - 11.0 K/uL   RBC 4.19 3.80 - 5.20 MIL/uL   Hemoglobin 10.4 (L) 12.0 - 16.0 g/dL   HCT 31.8 (L) 35.0 - 47.0 %   MCV 75.9 (L) 80.0 - 100.0 fL   MCH 24.9 (L) 26.0 - 34.0 pg   MCHC 32.8 32.0 - 36.0 g/dL   RDW 20.8 (H) 11.5 - 14.5 %   Platelets 587 (H) 150 - 440 K/uL  Comprehensive metabolic panel     Status: Abnormal   Collection Time: 09/08/16 11:46 AM  Result Value Ref Range   Sodium 128 (L) 135 - 145 mmol/L   Potassium 3.8 3.5 - 5.1 mmol/L   Chloride 95 (L) 101 - 111 mmol/L   CO2 21 (L) 22 - 32 mmol/L   Glucose, Bld 178 (H) 65 - 99 mg/dL   BUN 21 (H) 6 - 20 mg/dL   Creatinine, Ser 0.83 0.44 - 1.00 mg/dL   Calcium 9.3 8.9 - 10.3 mg/dL  Total Protein 8.2 (H) 6.5 - 8.1 g/dL   Albumin 2.7 (L) 3.5 - 5.0 g/dL   AST 22 15 - 41 U/L   ALT 25 14 - 54 U/L   Alkaline Phosphatase 220 (H) 38 - 126 U/L   Total Bilirubin 1.4 (H) 0.3 - 1.2 mg/dL   GFR calc non Af Amer >60 >60 mL/min   GFR calc Af Amer >60 >60 mL/min    Comment: (NOTE) The eGFR has been calculated using the CKD EPI equation. This calculation has not been validated in all clinical situations. eGFR's persistently <60 mL/min signify possible Chronic Kidney Disease.    Anion gap 12 5 - 15  Lactic acid, plasma     Status: Abnormal   Collection Time: 09/08/16 11:46 AM  Result Value Ref Range   Lactic Acid, Venous 3.0 (HH) 0.5 - 1.9 mmol/L    Comment: CRITICAL RESULT CALLED TO, READ BACK BY AND VERIFIED WITH KATE BOOMGARNER@1229  ON 09/08/16 BY [facility]   Blood gas, venous     Status: Abnormal (Preliminary result)   Collection Time: 09/08/16 11:49 AM  Result Value Ref Range   FIO2 1.00    Delivery systems BILEVEL POSITIVE AIRWAY PRESSURE    Peep/cpap 5.0 cm H20   Pressure support 10 cm H20   pH, Ven 7.39 7.250 - 7.430   pCO2, Ven 38 (L) 44.0 - 60.0 mmHg   pO2, Ven <31.0  (LL) 32.0 - 45.0 mmHg    Comment: CRITICAL RESULT CALLED TO, READ BACK BY AND VERIFIED WITH:  MD VERONESE AT 1212, 09/08/2016, LS    Bicarbonate 23.0 20.0 - 28.0 mmol/L   Acid-base deficit 1.7 0.0 - 2.0 mmol/L   O2 Saturation PENDING %   Patient temperature 37.0    Collection site VENOUS    Sample type VENOUS    Mechanical Rate 8     Current Facility-Administered Medications  Medication Dose Route Frequency Provider Last Rate Last Dose  . haloperidol lactate (HALDOL) injection 5 mg  5 mg Intramuscular Once Maine, MD      . LORazepam (ATIVAN) injection 2 mg  2 mg Intravenous Once Maine, MD      . morphine 10 MG/5ML solution 10 mg  10 mg Oral Q1H PRN Rudene Re, MD       Current Outpatient Prescriptions  Medication Sig Dispense Refill  . [START ON 09/16/2016] fluPHENAZine decanoate (PROLIXIN) 25 MG/ML injection Inject 1 mL (25 mg total) into the muscle once. Due on 3/28 5 mL 1  . albuterol (PROVENTIL HFA;VENTOLIN HFA) 108 (90 Base) MCG/ACT inhaler Inhale 1-2 puffs into the lungs every 4 (four) hours as needed for wheezing or shortness of breath. (Patient not taking: Reported on 09/08/2016) 1 Inhaler 0  . amantadine (SYMMETREL) 100 MG capsule Take 1 capsule (100 mg total) by mouth at bedtime. (Patient not taking: Reported on 09/08/2016) 30 capsule 0  . clopidogrel (PLAVIX) 75 MG tablet Take 75 mg by mouth daily.    Marland Kitchen ezetimibe (ZETIA) 10 MG tablet Take 10 mg by mouth daily.    . feeding supplement, ENSURE ENLIVE, (ENSURE ENLIVE) LIQD Take 237 mLs by mouth 4 (four) times daily - after meals and at bedtime. 237 mL 12  . fluPHENAZine (PROLIXIN) 5 MG tablet Take 1 tablet (5 mg total) by mouth at bedtime. (Patient not taking: Reported on 09/08/2016) 30 tablet 0  . levothyroxine (SYNTHROID, LEVOTHROID) 75 MCG tablet Take 75 mcg by mouth daily before breakfast.    . LORazepam (ATIVAN)  0.5 MG tablet Take 1 tablet (0.5 mg total) by mouth every 8 (eight) hours as needed for  anxiety or sedation. (Patient not taking: Reported on 09/08/2016) 15 tablet 0  . LORazepam (ATIVAN) 0.5 MG tablet Take 1 tablet (0.5 mg total) by mouth at bedtime. (Patient not taking: Reported on 09/08/2016) 30 tablet 0  . mometasone-formoterol (DULERA) 200-5 MCG/ACT AERO Inhale 2 puffs into the lungs 2 (two) times daily. (Patient not taking: Reported on 09/08/2016) 1 Inhaler 0  . OLANZapine (ZYPREXA) 7.5 MG tablet Take 7.5 mg by mouth daily.    . sertraline (ZOLOFT) 100 MG tablet Take 100 mg by mouth daily.    . simvastatin (ZOCOR) 80 MG tablet Take 80 mg by mouth daily.    . traZODone (DESYREL) 50 MG tablet Take 50 mg by mouth at bedtime.      Musculoskeletal: Strength & Muscle Tone: within normal limits Gait & Station: normal Patient leans: N/A  Psychiatric Specialty Exam: Physical Exam  Nursing note and vitals reviewed. Constitutional: She appears well-developed and well-nourished.  HENT:  Head: Normocephalic and atraumatic.  Eyes: Conjunctivae are normal. Pupils are equal, round, and reactive to light.  Neck: Normal range of motion.  Cardiovascular: Regular rhythm and normal heart sounds.   Respiratory: Effort normal. No respiratory distress.  GI: Soft.  Musculoskeletal: Normal range of motion.  Neurological: She is alert.  Skin: Skin is warm and dry.  Psychiatric: Her affect is inappropriate. Her speech is tangential. Thought content is paranoid and delusional. Cognition and memory are impaired. She expresses impulsivity. She expresses no homicidal and no suicidal ideation. She exhibits abnormal recent memory. She is inattentive.    Review of Systems  Constitutional: Negative.   HENT: Negative.   Eyes: Negative.   Respiratory: Positive for shortness of breath.   Cardiovascular: Negative.   Gastrointestinal: Negative.   Musculoskeletal: Negative.   Skin: Negative.   Neurological: Negative.   Psychiatric/Behavioral: Negative for depression, hallucinations, memory loss,  substance abuse and suicidal ideas. The patient is not nervous/anxious and does not have insomnia.     Blood pressure (!) 110/93, pulse (!) 25, temperature (!) 96.9 F (36.1 C), temperature source Axillary, resp. rate (!) 25, SpO2 (!) 72 %.There is no height or weight on file to calculate BMI.  General Appearance: Casual  Eye Contact:  Good  Speech:  Normal Rate  Volume:  Normal  Mood:  Euthymic  Affect:  Congruent and Inappropriate  Thought Process:  Disorganized  Orientation:  Full (Time, Place, and Person)  Thought Content:  Illogical, Delusions and Paranoid Ideation  Suicidal Thoughts:  No  Homicidal Thoughts:  No  Memory:  Immediate;   Fair Recent;   Fair Remote;   Fair  Judgement:  Impaired  Insight:  Lacking  Psychomotor Activity:  Normal  Concentration:  Concentration: Poor  Recall:  [employer] of Knowledge:  Fair  Language:  Fair  Akathisia:  No  Handed:  Right  AIMS (if indicated):     Assets:  Resilience Social Support  ADL's:  Intact  Cognition:  Impaired,  Mild  Sleep:        Treatment Plan Summary: Daily contact with patient to assess and evaluate symptoms and progress in treatment, Medication management and Plan 66 year old woman with schizophrenia. Returns to the hospital stating that she will not stay at her group home. This poses a significant problem. The patient does not have any clinical indication to be readmitted to the inpatient psychiatric unit. She does not  have a legal guardian to enforce placement. Patient clearly has psychotic ideas about her group home and probably doesn't really have capacity to make well thought out decisions but would be very resistant to forcing her back into the safety of a group home. Case reviewed with emergency room doctor and TTS. Continue current psychiatric medicines while we try to sort this out.  Disposition: Patient does not meet criteria for psychiatric inpatient admission. Supportive therapy provided about ongoing  stressors.  Alethia Berthold, MD 09/08/2016 4:14 PM

## 2016-09-08 NOTE — ED Provider Notes (Signed)
[facility] Emergency Department Provider Note  ____________________________________________  Time seen: Approximately 12:01 PM  I have reviewed the triage vital signs and the nursing notes.   HISTORY  Chief Complaint Shortness of Breath  Level 5 caveat:  Portions of the history and physical were unable to be obtained due to respiratory distress and mental illness   HPI NAAVYA Arias is a 66 y.o. female with history of recently diagnosed metastaticlung cancer not undergoing therapy, COPD, and schizophrenia who presents for evaluation of shortness of breath. Patient was discharged to a group home/behavioral health unit 5 days ago after being admitted for schizophrenia. During that admission patient was diagnosed metastatic lung cancer. According to notes in Epic patient refuse care and she was placed on hospice care with DNR/DNI and sent to the group home. She was found to have no medical capacity during her last admission and patient's sister and brother (only living relatives of the patient) made patient DNR/DNI. She has been having progressively worsening shortness of breath since arriving the group home 5 days ago. She was started on hospice 24 hours ago. Patient tells me that she wants intubation and CPR if she were to deteriorate at this time. She tells me that she is not DNR/DNI. Patient cannot explain to me what her medical problems are, she is not aware she has cancer, according to her. According to Wadie Lessen, NP Palliative care team involved in patient's care during last admission, patient's brother and sister who are patient's only family members signed DNR/DNI papers and placed patient in hospice care. MOST form and DNR/DNI forms are not present with the patient.   Past Medical History:  Diagnosis Date  . Cancer ([place])   . COPD (chronic obstructive pulmonary disease) ([place])   . Schizophrenia ([place])   . Tobacco use     Patient Active Problem List   Diagnosis Date Noted  . Failure to thrive in adult   . Palliative care by specialist   . DNR (do not resuscitate)   . Lung cancer ([place])   . Metastasis ([place])   . Schizophrenia ([place]) 08/28/2016  . COPD (chronic obstructive pulmonary disease) ([place]) 08/28/2016  . Tobacco use disorder 08/28/2016    History reviewed. No pertinent surgical history.  Prior to Admission medications   Medication Sig Start Date End Date Taking? Authorizing Provider  fluPHENAZine decanoate (PROLIXIN) 25 MG/ML injection Inject 1 mL (25 mg total) into the muscle once. Due on 3/28 09/16/16 09/16/16 Yes Hildred Priest, MD  albuterol (PROVENTIL HFA;VENTOLIN HFA) 108 (90 Base) MCG/ACT inhaler Inhale 1-2 puffs into the lungs every 4 (four) hours as needed for wheezing or shortness of breath. Patient not taking: Reported on 09/08/2016 09/02/16   Hildred Priest, MD  amantadine (SYMMETREL) 100 MG capsule Take 1 capsule (100 mg total) by mouth at bedtime. Patient not taking: Reported on 09/08/2016 09/02/16   Hildred Priest, MD  clopidogrel (PLAVIX) 75 MG tablet Take 75 mg by mouth daily.    Historical Provider, MD  ezetimibe (ZETIA) 10 MG tablet Take 10 mg by mouth daily.    Historical Provider, MD  feeding supplement, ENSURE ENLIVE, (ENSURE ENLIVE) LIQD Take 237 mLs by mouth 4 (four) times daily - after meals and at bedtime. 09/02/16   Hildred Priest, MD  fluPHENAZine (PROLIXIN) 5 MG tablet Take 1 tablet (5 mg total) by mouth at bedtime. Patient not taking: Reported on 09/08/2016 09/02/16   Hildred Priest, MD  levothyroxine (SYNTHROID, LEVOTHROID) 75 MCG tablet Take  75 mcg by mouth daily before breakfast.    Historical Provider, MD  LORazepam (ATIVAN) 0.5 MG tablet Take 1 tablet (0.5 mg total) by mouth every 8 (eight) hours as needed for anxiety or sedation. Patient not taking: Reported on 09/08/2016 09/02/16   Hildred Priest, MD  LORazepam (ATIVAN) 0.5 MG tablet Take 1  tablet (0.5 mg total) by mouth at bedtime. Patient not taking: Reported on 09/08/2016 09/02/16   Hildred Priest, MD  mometasone-formoterol [facility]) 200-5 MCG/ACT AERO Inhale 2 puffs into the lungs 2 (two) times daily. Patient not taking: Reported on 09/08/2016 09/02/16   Hildred Priest, MD  OLANZapine (ZYPREXA) 7.5 MG tablet Take 7.5 mg by mouth daily.    Historical Provider, MD  sertraline (ZOLOFT) 100 MG tablet Take 100 mg by mouth daily.    Historical Provider, MD  simvastatin (ZOCOR) 80 MG tablet Take 80 mg by mouth daily.    Historical Provider, MD  traZODone (DESYREL) 50 MG tablet Take 50 mg by mouth at bedtime.    Historical Provider, MD    Allergies Cogentin [benztropine]  History reviewed. No pertinent family history.  Social History Social History  Substance Use Topics  . Smoking status: Current Every Day Smoker    Packs/day: 1.00    Types: Cigarettes  . Smokeless tobacco: Current User  . Alcohol use Not on file    Review of Systems  Constitutional: Negative for fever. Eyes: Negative for visual changes. ENT: Negative for sore throat. Neck: No neck pain  Cardiovascular: Negative for chest pain. Respiratory: + shortness of breath. Gastrointestinal: Negative for abdominal pain, vomiting or diarrhea. Genitourinary: Negative for dysuria. Musculoskeletal: Negative for back pain. Skin: Negative for rash. Neurological: Negative for headaches, weakness or numbness. Psych: No SI or HI  ____________________________________________   PHYSICAL EXAM:  VITAL SIGNS: ED Triage Vitals  Enc Vitals Group     BP 09/08/16 1142 104/74     Pulse Rate 09/08/16 1142 (!) 118     Resp 09/08/16 1142 (!) 22     Temp 09/08/16 1201 (!) 96.9 F (36.1 C)     Temp Source 09/08/16 1201 Axillary     SpO2 09/08/16 1142 (!) 62 %     Weight --      Height --      Head Circumference --      Peak Flow --      Pain Score 09/08/16 1150 0     Pain Loc --      Pain Edu? --       Excl. in [place]? --     Constitutional: Alert and oriented x2, severe respiratory distress.  HEENT:      Head: Normocephalic and atraumatic.         Eyes: Conjunctivae are normal. Sclera is non-icteric. EOMI. PERRL      Mouth/Throat: Mucous membranes are moist.       Neck: Supple with no signs of meningismus. Cardiovascular: Tachycardic with regular rhythm. No murmurs, gallops, or rubs. 2+ symmetrical distal pulses are present in all extremities. No JVD. Respiratory: Increased work of breathing, severely diminished air movement on the right, normal movement on the left with no crackles or wheezing. hypoxic on room air. Gastrointestinal: Soft, non tender, and non distended with positive bowel sounds. No rebound or guarding. Musculoskeletal: Nontender with normal range of motion in all extremities. No edema, cyanosis, or erythema of extremities. Neurologic: Normal speech and language. Face is symmetric. Moving all extremities. No gross focal neurologic deficits are appreciated. Skin:  Skin is warm, dry and intact. No rash noted.  ____________________________________________   LABS (all labs ordered are listed, but only abnormal results are displayed)  Labs Reviewed  CBC - Abnormal; Notable for the following:       Result Value   WBC 22.0 (*)    Hemoglobin 10.4 (*)    HCT 31.8 (*)    MCV 75.9 (*)    MCH 24.9 (*)    RDW 20.8 (*)    Platelets 587 (*)    All other components within normal limits  COMPREHENSIVE METABOLIC PANEL - Abnormal; Notable for the following:    Sodium 128 (*)    Chloride 95 (*)    CO2 21 (*)    Glucose, Bld 178 (*)    BUN 21 (*)    Total Protein 8.2 (*)    Albumin 2.7 (*)    Alkaline Phosphatase 220 (*)    Total Bilirubin 1.4 (*)    All other components within normal limits  LACTIC ACID, PLASMA - Abnormal; Notable for the following:    Lactic Acid, Venous 3.0 (*)    All other components within normal limits  BLOOD GAS, VENOUS - Abnormal; Notable for the  following:    pCO2, Ven 38 (*)    pO2, Ven <31.0 (*)    All other components within normal limits  TROPONIN I  LACTIC ACID, PLASMA   ____________________________________________  EKG  ED ECG REPORT I, Rudene Re, the attending physician, personally viewed and interpreted this ECG.  Sinus tachycardia, rate of 118, normal intervals, normal axis, no ST elevations or depressions. ____________________________________________  RADIOLOGY  CXR: Persistent mass lesion/ consolidation right lower lobe with prominent volume loss on the right. Diffuse right lung infiltrate and right pleural effusion. Findings consistent patient's known malignancy. Superimposed pneumonia cannot be excluded. Right lung atelectatic changes have increased from prior exam. ____________________________________________   PROCEDURES  Procedure(s) performed: None Procedures Critical Care performed: yes  CRITICAL CARE Performed by: Rudene Re  ?  Total critical care time: 70 min  Critical care time was exclusive of separately billable procedures and treating other patients.  Critical care was necessary to treat or prevent imminent or life-threatening deterioration.  Critical care was time spent personally by me on the following activities: development of treatment plan with patient and/or surrogate as well as nursing, discussions with consultants, evaluation of patient's response to treatment, examination of patient, obtaining history from patient or surrogate, ordering and performing treatments and interventions, ordering and review of laboratory studies, ordering and review of radiographic studies, pulse oximetry and re-evaluation of patient's condition.  ____________________________________________   INITIAL IMPRESSION / ASSESSMENT AND PLAN / ED COURSE  66 y.o. female with history of recently diagnosed metastaticlung cancer not undergoing therapy, COPD, and schizophrenia who presents for  evaluation of severe respiratory distress. According to hospice and palliative care patient is DNR/DNI, has no capacity, and has a most form that is signed by her brother and sister during her last hospitalization for which she was discharged 5 days ago. Patient at this time says that she wants full care. Palliative care RN is at the bedside helping [place] determine best steps in this patient's care however at this time we will proceed with patient's wishes. Patient was placed on BiPAP. Initial VBG showing a pH of 7.39, PCO2 of 38 on BiPAP. Patient's chest x-ray showing diffuse metastatic disease on the right lung. Patient is 100% on BiPAP. EKG no ischemic changes. Patient was started on DuoNeb treatments,  fluids. Antibiotics were ordered for a white count of 22,000. DDX worsening metastatic disease with superimposed COPD exacerbation versus pulmonary embolism. At this time we'll hold off on CT scan of her chest until her kidney function is back.  Clinical Course as of Sep 08 1921  Tue Sep 08, 2016  1240 MOST form now in the ED with patient. I spoken with patient's sister Ms. Alyce Pagan on [phone] who confirms that patient is DNR/DNI and that patient does not have any other family members other than sister Francesca Jewett and brother. Both sister and brother made decision together during her last admission to sign MOST and DNR/DNI forms. They know the patient has no treatment for her cancer has severe schizophrenia. They tell me the patient has no understanding of her illness, she is not capable of making medical decisions. Patient has removed the BiPAP and IV. She removed all telemetry leads and was trying to leave the room. I explained to her that she will die without interventions that we were doing. Patient tells me that she rather die than have any further interventions done. Patient was trying to walk out of the room naked and required reorientation and distraction to calm her down. Patient wanted  to put her clothes and sit on a chair in room. She does not want to be in the stretcher. Her brother who lives in [place] and try to come to the emergency room to see the patient. The family does not want IV fluids, IV antibiotics, BiPAP, intubation, or any further treatment other than symptom control at this time. Palliative team in the ED discussed with sister as well and are working on finding placement for patient.  [CV]  1249 PO morphine ordered PRN at the request of palliative team. Still working on placement. Patient remains stable off of BiPAP and off monitors. Sitting in the room talking to RN. Patient is pleasant at this time and not agitated. Tells me she does not want any interventions and says "I rather die then have anything done to me now". I explained again to her that she will die without oxygen, medications, and BiPAP and patient tells me these are her wishes.   [CV]  1300 She keeps waxing and waning. She was pleasant and asking for food. She was provided with a meal tray. She then started becoming agitated and started fighting staff to get out of her room. She wants to leave the hospital to go to [facility] although she has no plan on how to get there. For patient and staff safety patient will be given IM Haldol and Ativan. [facility] has a room for patient however they're unable to transport her if she is agitated. Her family is in agreement with this plan.  [CV]  1194 Patient remains stable on no BiPAP, eating, and talking to tech [place]. No meds given. [facility] representative Flo Shanks recommends pscyh eval for placement as patient has no medical needs for placement with  Hospice at this time. TTS has been consulted.  [CV]  [id number] Patient evaluated by Dr. Weber Cooks and patient does not meet criteria for inpatient psychiatric admission. IVC paperwork taken as patient has been trying to leave the ED and she has been deemed incompetent to make medical decisions. She has  remain stable with no indication for Hospice inpatient placement. Hospice following in case patient decompensates. TTS and SW trying to find placement for patient. Patient is DNR/DNI and comfort care only. Meds have been ordered. Brother  and sister have been updated.   [CV]    Clinical Course User Index [CV] Rudene Re, MD    Pertinent labs & imaging results that were available during my care of the patient were reviewed by me and considered in my medical decision making (see chart for details).    ____________________________________________   FINAL CLINICAL IMPRESSION(S) / ED DIAGNOSES  Final diagnoses:  Primary malignant neoplasm of right lung metastatic to other site ([place])  Hypoxia  Schizophrenia, unspecified type ([place])      NEW MEDICATIONS STARTED DURING THIS VISIT:  New Prescriptions   No medications on file     Note:  This document was prepared using Dragon voice recognition software and may include unintentional dictation errors.    Rudene Re, MD 09/08/16 1924

## 2016-09-08 NOTE — ED Notes (Addendum)
Pt ripped out IV, ripped off bipap mask. Pt ripped off all cardiac leads. Pt states she wants to go to [facility], states that was her daddy's plan for her. Pt getting out of bed. Pt assisted to sit on bed. Pt asking where her shoes are, pt placed shoes back on her feet and sitting in chair in room. Pt states she wants to go to behavioral because the staff is nice. Pt states she is feeling better. Pt breathing fast, will not allow this  RN to put pulse ox on her to monitor her. Pt drinking coke, putting jacket on   RT at bedside, this RN staying with pt and Jonni Sanger ED Runner, broadcasting/film/video at bedside. Pt handed Jonni Sanger ED Runner, broadcasting/film/video a housing agreement for a place in [place] and stated it was from her Chief Executive Officer. Pt holding cards in her hand from her purse. Pt crossing legs, sitting in chair. Pt states she just got her hair cut. Pt smiling and put her hood from jacket on her head.   Group home director who brought pt in is not here anymore.

## 2016-09-09 ENCOUNTER — Encounter: Payer: Self-pay | Admitting: Internal Medicine

## 2016-09-09 DIAGNOSIS — R0600 Dyspnea, unspecified: Secondary | ICD-10-CM

## 2016-09-09 DIAGNOSIS — R0609 Other forms of dyspnea: Secondary | ICD-10-CM

## 2016-09-09 DIAGNOSIS — Z66 Do not resuscitate: Secondary | ICD-10-CM

## 2016-09-09 LAB — LACTIC ACID, PLASMA
LACTIC ACID, VENOUS: 2.6 mmol/L — AB (ref 0.5–1.9)
Lactic Acid, Venous: 3 mmol/L (ref 0.5–1.9)

## 2016-09-09 LAB — CBC
HCT: 26.9 % — ABNORMAL LOW (ref 35.0–47.0)
Hemoglobin: 8.8 g/dL — ABNORMAL LOW (ref 12.0–16.0)
MCH: 24.8 pg — ABNORMAL LOW (ref 26.0–34.0)
MCHC: 32.8 g/dL (ref 32.0–36.0)
MCV: 75.6 fL — ABNORMAL LOW (ref 80.0–100.0)
PLATELETS: 586 10*3/uL — AB (ref 150–440)
RBC: 3.55 MIL/uL — ABNORMAL LOW (ref 3.80–5.20)
RDW: 20.4 % — AB (ref 11.5–14.5)
WBC: 19.9 10*3/uL — AB (ref 3.6–11.0)

## 2016-09-09 LAB — MRSA PCR SCREENING: MRSA BY PCR: NEGATIVE

## 2016-09-09 LAB — BASIC METABOLIC PANEL
ANION GAP: 10 (ref 5–15)
BUN: 19 mg/dL (ref 6–20)
CHLORIDE: 98 mmol/L — AB (ref 101–111)
CO2: 20 mmol/L — ABNORMAL LOW (ref 22–32)
Calcium: 8.3 mg/dL — ABNORMAL LOW (ref 8.9–10.3)
Creatinine, Ser: 0.47 mg/dL (ref 0.44–1.00)
GFR calc Af Amer: >60 mL/min (ref >60)
GFR calc non Af Amer: >60 mL/min (ref >60)
Glucose, Bld: 234 mg/dL — ABNORMAL HIGH (ref 65–99)
POTASSIUM: 3.4 mmol/L — AB (ref 3.5–5.1)
SODIUM: 128 mmol/L — AB (ref 135–145)

## 2016-09-09 LAB — BLOOD GAS, VENOUS
Acid-base deficit: 1.7 mmol/L (ref 0.0–2.0)
Bicarbonate: 23 mmol/L (ref 20.0–28.0)
Delivery systems: POSITIVE
FIO2: 1
MECHANICAL RATE: 8
PEEP/CPAP: 5 cmH2O
PRESSURE SUPPORT: 10 cmH2O
Patient temperature: 37
pCO2, Ven: 38 mmHg — ABNORMAL LOW (ref 44.0–60.0)
pH, Ven: 7.39 (ref 7.250–7.430)
pO2, Ven: <31 mmHg — CL (ref 32.0–45.0)

## 2016-09-09 MED ORDER — ACETAMINOPHEN 325 MG PO TABS: 650.0000 mg | ORAL_TABLET | Freq: Four times a day (QID) | ORAL | Status: DC | PRN

## 2016-09-09 MED ORDER — SODIUM CHLORIDE 0.9 % IV BOLUS (SEPSIS)
1000.0000 mL | Freq: Once | INTRAVENOUS | Status: AC
  Administered 2016-09-09: 1000 mL via INTRAVENOUS

## 2016-09-09 MED ORDER — SENNOSIDES-DOCUSATE SODIUM 8.6-50 MG PO TABS: 1.0000 | ORAL_TABLET | Freq: Every evening | ORAL | Status: DC | PRN

## 2016-09-09 MED ORDER — POTASSIUM CHLORIDE CRYS ER 20 MEQ PO TBCR
40.0000 meq | EXTENDED_RELEASE_TABLET | Freq: Once | ORAL | Status: AC
  Administered 2016-09-09: 15:00:00 40 meq via ORAL
  Filled 2016-09-09: qty 2

## 2016-09-09 MED ORDER — MORPHINE SULFATE (PF) 2 MG/ML IV SOLN: 1.0000 mg | INTRAVENOUS | Status: DC | PRN

## 2016-09-09 MED ORDER — DEXTROSE 5 % IV SOLN: 500.0000 mg | INTRAVENOUS | Status: DC

## 2016-09-09 MED ORDER — DEXTROSE 5 % IV SOLN
1.0000 g | Freq: Once | INTRAVENOUS | Status: DC
  Filled 2016-09-09: qty 10

## 2016-09-09 MED ORDER — SODIUM CHLORIDE 0.9 % IV SOLN: 250.0000 mL | INTRAVENOUS | Status: DC | PRN

## 2016-09-09 MED ORDER — DEXTROSE 5 % IV SOLN
500.0000 mg | Freq: Once | INTRAVENOUS | Status: AC
  Administered 2016-09-09: 500 mg via INTRAVENOUS
  Filled 2016-09-09: qty 500

## 2016-09-09 MED ORDER — VANCOMYCIN HCL IN DEXTROSE 750-5 MG/150ML-% IV SOLN
750.0000 mg | Freq: Once | INTRAVENOUS | Status: AC
  Administered 2016-09-09: 750 mg via INTRAVENOUS
  Filled 2016-09-09: qty 150

## 2016-09-09 MED ORDER — ENSURE ENLIVE PO LIQD
237.0000 mL | Freq: Three times a day (TID) | ORAL | Status: DC
  Administered 2016-09-09 – 2016-09-10 (×4): 237 mL via ORAL

## 2016-09-09 MED ORDER — SODIUM CHLORIDE 0.9% FLUSH
3.0000 mL | Freq: Two times a day (BID) | INTRAVENOUS | Status: DC
  Administered 2016-09-09 – 2016-09-14 (×5): 3 mL via INTRAVENOUS

## 2016-09-09 MED ORDER — OLANZAPINE 7.5 MG PO TABS
7.5000 mg | ORAL_TABLET | Freq: Every day | ORAL | Status: DC
  Administered 2016-09-09 – 2016-09-22 (×14): 7.5 mg via ORAL
  Filled 2016-09-09 (×13): qty 1
  Filled 2016-09-09: qty 2
  Filled 2016-09-09 (×2): qty 1

## 2016-09-09 MED ORDER — SODIUM CHLORIDE 0.9% FLUSH: 3.0000 mL | INTRAVENOUS | Status: DC | PRN

## 2016-09-09 MED ORDER — ACETAMINOPHEN 650 MG RE SUPP: 650.0000 mg | Freq: Four times a day (QID) | RECTAL | Status: DC | PRN

## 2016-09-09 MED ORDER — ONDANSETRON HCL 4 MG PO TABS
4.0000 mg | ORAL_TABLET | Freq: Four times a day (QID) | ORAL | Status: DC | PRN
  Administered 2016-09-17: 4 mg via ORAL
  Filled 2016-09-09: qty 1

## 2016-09-09 MED ORDER — SODIUM CHLORIDE 0.9 % IV SOLN
INTRAVENOUS | Status: DC
  Administered 2016-09-09: 05:00:00 via INTRAVENOUS

## 2016-09-09 MED ORDER — ENOXAPARIN SODIUM 40 MG/0.4ML ~~LOC~~ SOLN: 40.0000 mg | SUBCUTANEOUS | Status: DC

## 2016-09-09 MED ORDER — SERTRALINE HCL 50 MG PO TABS
100.0000 mg | ORAL_TABLET | Freq: Every day | ORAL | Status: DC
  Administered 2016-09-09 – 2016-09-22 (×14): 100 mg via ORAL
  Filled 2016-09-09 (×15): qty 2

## 2016-09-09 MED ORDER — LEVOTHYROXINE SODIUM 50 MCG PO TABS
75.0000 ug | ORAL_TABLET | Freq: Every day | ORAL | Status: DC
  Administered 2016-09-09 – 2016-09-10 (×2): 75 ug via ORAL
  Filled 2016-09-09: qty 1
  Filled 2016-09-09: qty 2

## 2016-09-09 MED ORDER — FLUPHENAZINE HCL 5 MG PO TABS
5.0000 mg | ORAL_TABLET | Freq: Every day | ORAL | Status: DC
  Administered 2016-09-09 – 2016-09-21 (×13): 5 mg via ORAL
  Filled 2016-09-09 (×14): qty 1

## 2016-09-09 MED ORDER — DEXTROSE 5 % IV SOLN: 1.0000 g | INTRAVENOUS | Status: DC

## 2016-09-09 MED ORDER — SODIUM CHLORIDE 0.9% FLUSH
3.0000 mL | Freq: Two times a day (BID) | INTRAVENOUS | Status: DC
  Administered 2016-09-10 – 2016-09-14 (×2): 3 mL via INTRAVENOUS

## 2016-09-09 MED ORDER — HALOPERIDOL LACTATE 5 MG/ML IJ SOLN: 2.0000 mg | Freq: Four times a day (QID) | INTRAMUSCULAR | Status: DC | PRN

## 2016-09-09 MED ORDER — ATORVASTATIN CALCIUM 20 MG PO TABS
40.0000 mg | ORAL_TABLET | Freq: Every day | ORAL | Status: DC
Start: 2016-09-09 — End: 2016-09-09

## 2016-09-09 MED ORDER — LORAZEPAM 0.5 MG PO TABS
0.5000 mg | ORAL_TABLET | Freq: Every day | ORAL | Status: DC
  Administered 2016-09-09: 0.5 mg via ORAL
  Filled 2016-09-09: qty 1

## 2016-09-09 MED ORDER — DEXTROSE 5 % IV SOLN
2.0000 g | Freq: Two times a day (BID) | INTRAVENOUS | Status: DC
  Administered 2016-09-09: 2 g via INTRAVENOUS
  Filled 2016-09-09 (×3): qty 2

## 2016-09-09 MED ORDER — ALBUTEROL SULFATE HFA 108 (90 BASE) MCG/ACT IN AERS
1.0000 | INHALATION_SPRAY | RESPIRATORY_TRACT | Status: DC | PRN
  Filled 2016-09-09: qty 6.7

## 2016-09-09 MED ORDER — EZETIMIBE 10 MG PO TABS
10.0000 mg | ORAL_TABLET | Freq: Every day | ORAL | Status: DC
  Administered 2016-09-09: 10 mg via ORAL
  Filled 2016-09-09: qty 1

## 2016-09-09 MED ORDER — AMANTADINE HCL 100 MG PO CAPS
100.0000 mg | ORAL_CAPSULE | Freq: Every day | ORAL | Status: DC
  Administered 2016-09-09 – 2016-09-21 (×13): 100 mg via ORAL
  Filled 2016-09-09 (×14): qty 1

## 2016-09-09 MED ORDER — MOMETASONE FURO-FORMOTEROL FUM 200-5 MCG/ACT IN AERO
2.0000 | INHALATION_SPRAY | Freq: Two times a day (BID) | RESPIRATORY_TRACT | Status: DC
  Administered 2016-09-09 – 2016-09-10 (×2): 2 via RESPIRATORY_TRACT
  Filled 2016-09-09 (×2): qty 8.8

## 2016-09-09 MED ORDER — TRAZODONE HCL 50 MG PO TABS
50.0000 mg | ORAL_TABLET | Freq: Every day | ORAL | Status: DC
  Administered 2016-09-09 – 2016-09-21 (×13): 50 mg via ORAL
  Filled 2016-09-09 (×13): qty 1

## 2016-09-09 MED ORDER — SODIUM CHLORIDE 0.9 % IV BOLUS (SEPSIS)
250.0000 mL | Freq: Once | INTRAVENOUS | Status: AC
  Administered 2016-09-09: 250 mL via INTRAVENOUS

## 2016-09-09 MED ORDER — VANCOMYCIN HCL IN DEXTROSE 750-5 MG/150ML-% IV SOLN
750.0000 mg | INTRAVENOUS | Status: DC
  Filled 2016-09-09: qty 150

## 2016-09-09 MED ORDER — CLOPIDOGREL BISULFATE 75 MG PO TABS
75.0000 mg | ORAL_TABLET | Freq: Every day | ORAL | Status: DC
  Administered 2016-09-09: 75 mg via ORAL
  Filled 2016-09-09: qty 1

## 2016-09-09 MED ORDER — ONDANSETRON HCL 4 MG/2ML IJ SOLN: 4.0000 mg | Freq: Four times a day (QID) | INTRAMUSCULAR | Status: DC | PRN

## 2016-09-09 MED ORDER — LORAZEPAM 0.5 MG PO TABS: 0.5000 mg | ORAL_TABLET | Freq: Three times a day (TID) | ORAL | Status: DC | PRN

## 2016-09-09 NOTE — ED Notes (Signed)
Patient bed alarm going off, patient getting up.

## 2016-09-09 NOTE — ED Notes (Signed)
Patient sitting in bed singing

## 2016-09-09 NOTE — Consult Note (Signed)
Consultation Note Date: 09/09/2016   Patient Name: Haley Arias  DOB: 04-04-1951  MRN: [id number]  Age / Sex: 66 y.o., female  PCP: No Pcp Per Patient Referring Physician: Gladstone Lighter, MD;Pa*  Reason for Consultation: Establishing goals of care and Psychosocial/spiritual support  HPI/Patient Profile: 66 y.o. female   admitted on 09/08/2016 with  a known history of Schizophrenia metastatic lung cancer, emphysema, tobacco abuse presented to the emergency room with difficulty breathing.   She is DO NOT RESUSCITATE by CODE STATUS and does not have any medical capacity for making decision during the last presentation.  Focus of care established on last admission  -see my notes from 09-02-16 and 09-03-16   This situation was addressed on last admission.   Although there was no documented HPOA and her husband's whereabouts are unknown and is not available and they have been estranged for many years, her  sister and brother are acting in her best interest.  - see MOST form in hard chart   Clinical Assessment and Goals of Care:  This NP Wadie Lessen again  reviewed medical records, received report from team, assessed the patient and then spoke to her sister/ Rose Vincent/main support person   to discuss diagnosis, prognosis, GOC, EOL wishes disposition and options.  We discussed this situation in great detail last week during Ms. Bergevin previous admission.  Family understand the very poor prognosis and they hope for comfort and dignity, no life prolonging measures.   The difference between a aggressive medical intervention path  and a palliative comfort care path for this patient at this time was had.    MOST form completed previously, see hard chart  Natural trajectory and expectations at EOL were discussed.  Questions and concerns addressed.   Family encouraged to call with questions or concerns.  PMT  will continue to support holistically.  SUMMARY OF RECOMMENDATIONS    Code Status/Advance Care Planning:  DNR  Symptom Management:   Dyspnea: Morphine 1 mg IV every 1 hr prn  Agitation: Haldol 2 mg IV every 6 hrs prn  Palliative Prophylaxis:   Aspiration, Bowel Regimen, Frequent Pain Assessment and Oral Care  Additional Recommendations (Limitations, Scope, Preferences):  Full Comfort Care  Psycho-social/Spiritual:   Desire for further Chaplaincy support:no  Additional Recommendations: Education on Hospice  Prognosis:   < 2 weeks  Discharge Planning: To Be Determined      Primary Diagnoses: Present on Admission: . COPD (chronic obstructive pulmonary disease) ([place]) . Lung cancer ([place]) . Schizophrenia ([place]) . Sepsis ([place]) . CAP (community acquired pneumonia)   I have reviewed the medical record, interviewed the patient and family, and examined the patient. The following aspects are pertinent.  Past Medical History:  Diagnosis Date  . Cancer ([place])   . COPD (chronic obstructive pulmonary disease) ([place])   . Schizophrenia ([place])   . Tobacco use    Social History   Social History  . Marital status: Married    Spouse name: N/A  . Number of children: N/A  .  Years of education: N/A   Social History Main Topics  . Smoking status: Current Every Day Smoker    Packs/day: 1.00    Types: Cigarettes  . Smokeless tobacco: Current User  . Alcohol use No  . Drug use: No  . Sexual activity: Not Asked   Other Topics Concern  . None   Social History Narrative  . None   Family History  Problem Relation Age of Onset  . Bone cancer Father    Scheduled Meds: . amantadine  100 mg Oral QHS  . feeding supplement (ENSURE ENLIVE)  237 mL Oral TID PC & HS  . fluPHENAZine  5 mg Oral QHS  . levothyroxine  75 mcg Oral QAC breakfast  . LORazepam  0.5 mg Oral QHS  . mometasone-formoterol  2 puff Inhalation BID  . OLANZapine  7.5 mg Oral Daily  . sertraline  100 mg  Oral Daily  . sodium chloride flush  3 mL Intravenous Q12H  . sodium chloride flush  3 mL Intravenous Q12H  . traZODone  50 mg Oral QHS   Continuous Infusions: PRN Meds:.sodium chloride, acetaminophen **OR** acetaminophen, albuterol, LORazepam, ondansetron **OR** ondansetron (ZOFRAN) IV, senna-docusate, sodium chloride flush Medications Prior to Admission:  Prior to Admission medications   Medication Sig Start Date End Date Taking? Authorizing Provider  fluPHENAZine decanoate (PROLIXIN) 25 MG/ML injection Inject 1 mL (25 mg total) into the muscle once. Due on 3/28 09/16/16 09/16/16 Yes Hildred Priest, MD  albuterol (PROVENTIL HFA;VENTOLIN HFA) 108 (90 Base) MCG/ACT inhaler Inhale 1-2 puffs into the lungs every 4 (four) hours as needed for wheezing or shortness of breath. Patient not taking: Reported on 09/08/2016 09/02/16   Hildred Priest, MD  amantadine (SYMMETREL) 100 MG capsule Take 1 capsule (100 mg total) by mouth at bedtime. Patient not taking: Reported on 09/08/2016 09/02/16   Hildred Priest, MD  clopidogrel (PLAVIX) 75 MG tablet Take 75 mg by mouth daily.    Historical Provider, MD  ezetimibe (ZETIA) 10 MG tablet Take 10 mg by mouth daily.    Historical Provider, MD  feeding supplement, ENSURE ENLIVE, (ENSURE ENLIVE) LIQD Take 237 mLs by mouth 4 (four) times daily - after meals and at bedtime. 09/02/16   Hildred Priest, MD  fluPHENAZine (PROLIXIN) 5 MG tablet Take 1 tablet (5 mg total) by mouth at bedtime. Patient not taking: Reported on 09/08/2016 09/02/16   Hildred Priest, MD  levothyroxine (SYNTHROID, LEVOTHROID) 75 MCG tablet Take 75 mcg by mouth daily before breakfast.    Historical Provider, MD  LORazepam (ATIVAN) 0.5 MG tablet Take 1 tablet (0.5 mg total) by mouth every 8 (eight) hours as needed for anxiety or sedation. Patient not taking: Reported on 09/08/2016 09/02/16   Hildred Priest, MD  LORazepam (ATIVAN) 0.5 MG tablet  Take 1 tablet (0.5 mg total) by mouth at bedtime. Patient not taking: Reported on 09/08/2016 09/02/16   Hildred Priest, MD  mometasone-formoterol [facility]) 200-5 MCG/ACT AERO Inhale 2 puffs into the lungs 2 (two) times daily. Patient not taking: Reported on 09/08/2016 09/02/16   Hildred Priest, MD  OLANZapine (ZYPREXA) 7.5 MG tablet Take 7.5 mg by mouth daily.    Historical Provider, MD  sertraline (ZOLOFT) 100 MG tablet Take 100 mg by mouth daily.    Historical Provider, MD  simvastatin (ZOCOR) 80 MG tablet Take 80 mg by mouth daily.    Historical Provider, MD  traZODone (DESYREL) 50 MG tablet Take 50 mg by mouth at bedtime.    Historical Provider, MD  Allergies  Allergen Reactions  . Cogentin [Benztropine] Anxiety   Review of Systems  Unable to perform ROS: Acuity of condition    Physical Exam  Constitutional: She appears cachectic. She appears ill.  Cardiovascular: Normal rate, regular rhythm and normal heart sounds.   Pulmonary/Chest: She has decreased breath sounds in the right middle field, the right lower field and the left lower field.  Skin: Skin is warm and dry.    Vital Signs: BP (!) 92/47 (BP Location: Right Arm)   Pulse 93   Temp 98.2 F (36.8 C) (Oral)   Resp 18   SpO2 97%  Pain Assessment: No/denies pain   Pain Score: 0-No pain   SpO2: SpO2: 97 % O2 Device:SpO2: 97 % O2 Flow Rate: .   IO: Intake/output summary:  Intake/Output Summary (Last 24 hours) at 09/09/16 1605 Last data filed at 09/09/16 0941  Gross per 24 hour  Intake             1500 ml  Output              400 ml  Net             1100 ml    LBM:   Baseline Weight:   Most recent weight:       Palliative Assessment/Data: 30 % at best    Discussed with Dr Tressia Miners   Time In: 1615 Time Out: 1730 Time Total: 75 min Greater than 50%  of this time was spent counseling and coordinating care related to the above assessment and plan.  Signed by: Wadie Lessen, NP     Please contact Palliative Medicine Team phone at [phone] for questions and concerns.  For individual provider: See Shea Evans

## 2016-09-09 NOTE — H&P (Addendum)
[facility] [place] NAME: Haley Arias    MR#:  [id number]  DATE OF BIRTH:  Jan 19, 1951  DATE OF ADMISSION:  09/08/2016  PRIMARY CARE PHYSICIAN: No PCP Per Patient   REQUESTING/REFERRING PHYSICIAN:   CHIEF COMPLAINT:   Chief Complaint  Patient presents with  . Shortness of Breath    HISTORY OF PRESENT ILLNESS: Haley Arias  is a 66 y.o. female with a known history of Schizophrenia metastatic lung cancer, emphysema, tobacco abuse presented to the emergency room with difficulty breathing. Patient has refused therapy for metastatic lung cancer and currently under hospice. She is DO NOT RESUSCITATE by CODE STATUS and does not have any medical capacity for making decision during the last presentation. Her brother takes care of her medical decisions and case was discussed by ER physician with her brother who agreed for IV fluids and antibiotics. Did not want any aggressive measures such as intubation and ventilator. Patient was a short of breath and was on BiPAP initially which was weaned off in the emergency room. She has cough and low-grade fever fever. Has some chills. Patient was recently discharged to behavioral unit after being admitted for schizophrenia. Hospitalist service was consulted for further care of the patient. Patient agreed for IV fluids and antibiotics. Her shortness of breath has improved in the emergency room. She is comfortable and saturating well on oxygen via nasal cannula.Patient has been involuntarily committed by ER physician as she was homeless, had schizophrenia and lacked capacity of making decision.  PAST MEDICAL HISTORY:   Past Medical History:  Diagnosis Date  . Cancer ([place])   . COPD (chronic obstructive pulmonary disease) ([place])   . Schizophrenia ([place])   . Tobacco use     PAST SURGICAL HISTORY: Past Surgical History:  Procedure Laterality Date  . NO PAST SURGERIES    . none      SOCIAL HISTORY:  Social  History  Substance Use Topics  . Smoking status: Current Every Day Smoker    Packs/day: 1.00    Types: Cigarettes  . Smokeless tobacco: Current User  . Alcohol use No    FAMILY HISTORY:  Family History  Problem Relation Age of Onset  . Bone cancer Father     DRUG ALLERGIES:  Allergies  Allergen Reactions  . Cogentin [Benztropine] Anxiety    REVIEW OF SYSTEMS:   CONSTITUTIONAL: No fever, has weakness.  EYES: No blurred or double vision.  EARS, NOSE, AND THROAT: No tinnitus or ear pain.  RESPIRATORY: Has cough, shortness of breath, No  wheezing or hemoptysis.  CARDIOVASCULAR: No chest pain, orthopnea, edema.  GASTROINTESTINAL: No nausea, vomiting, diarrhea or abdominal pain.  GENITOURINARY: No dysuria, hematuria.  ENDOCRINE: No polyuria, nocturia,  HEMATOLOGY: No anemia, easy bruising or bleeding SKIN: No rash or lesion. MUSCULOSKELETAL: No joint pain or arthritis.   NEUROLOGIC: No tingling, numbness, weakness.  PSYCHIATRY: No anxiety or depression.   MEDICATIONS AT HOME:  Prior to Admission medications   Medication Sig Start Date End Date Taking? Authorizing Provider  fluPHENAZine decanoate (PROLIXIN) 25 MG/ML injection Inject 1 mL (25 mg total) into the muscle once. Due on 3/28 09/16/16 09/16/16 Yes Hildred Priest, MD  albuterol (PROVENTIL HFA;VENTOLIN HFA) 108 (90 Base) MCG/ACT inhaler Inhale 1-2 puffs into the lungs every 4 (four) hours as needed for wheezing or shortness of breath. Patient not taking: Reported on 09/08/2016 09/02/16   Hildred Priest, MD  amantadine (SYMMETREL) 100 MG capsule Take 1 capsule (100  mg total) by mouth at bedtime. Patient not taking: Reported on 09/08/2016 09/02/16   Hildred Priest, MD  clopidogrel (PLAVIX) 75 MG tablet Take 75 mg by mouth daily.    Historical Provider, MD  ezetimibe (ZETIA) 10 MG tablet Take 10 mg by mouth daily.    Historical Provider, MD  feeding supplement, ENSURE ENLIVE, (ENSURE ENLIVE)  LIQD Take 237 mLs by mouth 4 (four) times daily - after meals and at bedtime. 09/02/16   Hildred Priest, MD  fluPHENAZine (PROLIXIN) 5 MG tablet Take 1 tablet (5 mg total) by mouth at bedtime. Patient not taking: Reported on 09/08/2016 09/02/16   Hildred Priest, MD  levothyroxine (SYNTHROID, LEVOTHROID) 75 MCG tablet Take 75 mcg by mouth daily before breakfast.    Historical Provider, MD  LORazepam (ATIVAN) 0.5 MG tablet Take 1 tablet (0.5 mg total) by mouth every 8 (eight) hours as needed for anxiety or sedation. Patient not taking: Reported on 09/08/2016 09/02/16   Hildred Priest, MD  LORazepam (ATIVAN) 0.5 MG tablet Take 1 tablet (0.5 mg total) by mouth at bedtime. Patient not taking: Reported on 09/08/2016 09/02/16   Hildred Priest, MD  mometasone-formoterol [facility]) 200-5 MCG/ACT AERO Inhale 2 puffs into the lungs 2 (two) times daily. Patient not taking: Reported on 09/08/2016 09/02/16   Hildred Priest, MD  OLANZapine (ZYPREXA) 7.5 MG tablet Take 7.5 mg by mouth daily.    Historical Provider, MD  sertraline (ZOLOFT) 100 MG tablet Take 100 mg by mouth daily.    Historical Provider, MD  simvastatin (ZOCOR) 80 MG tablet Take 80 mg by mouth daily.    Historical Provider, MD  traZODone (DESYREL) 50 MG tablet Take 50 mg by mouth at bedtime.    Historical Provider, MD      PHYSICAL EXAMINATION:   VITAL SIGNS: Blood pressure 109/69, pulse (!) 105, temperature (!) 96.9 F (36.1 C), temperature source Axillary, resp. rate (!) 26, SpO2 96 %.  GENERAL:  67 y.o.-year-old patient lying in the bed with no acute distress.  EYES: Pupils equal, round, reactive to light and accommodation. No scleral icterus. Extraocular muscles intact.  HEENT: Head atraumatic, normocephalic. Oropharynx and nasopharynx clear.  NECK:  Supple, no jugular venous distention. No thyroid enlargement, no tenderness.  LUNGS: Decreased breath sounds bilaterally, rales heard in right  lung. No use of accessory muscles of respiration.  CARDIOVASCULAR: S1, S2 normal. No murmurs, rubs, or gallops.  ABDOMEN: Soft, nontender, nondistended. Bowel sounds present. No organomegaly or mass.  EXTREMITIES: No pedal edema, cyanosis, or clubbing.  NEUROLOGIC: Cranial nerves II through XII are intact. Muscle strength 5/5 in all extremities. Sensation intact. Gait not checked.  PSYCHIATRIC: The patient is alert and oriented x 3.  SKIN: No obvious rash, lesion, or ulcer.   LABORATORY PANEL:   CBC  Recent Labs Lab 09/08/16 1146  WBC 22.0*  HGB 10.4*  HCT 31.8*  PLT 587*  MCV 75.9*  MCH 24.9*  MCHC 32.8  RDW 20.8*   ------------------------------------------------------------------------------------------------------------------  Chemistries   Recent Labs Lab 09/08/16 1146  NA 128*  K 3.8  CL 95*  CO2 21*  GLUCOSE 178*  BUN 21*  CREATININE 0.83  CALCIUM 9.3  AST 22  ALT 25  ALKPHOS 220*  BILITOT 1.4*   ------------------------------------------------------------------------------------------------------------------ estimated creatinine clearance is 45 mL/min (by C-G formula based on SCr of 0.83 mg/dL). ------------------------------------------------------------------------------------------------------------------ No results for input(s): TSH, T4TOTAL, T3FREE, THYROIDAB in the last 72 hours.  Invalid input(s): FREET3   Coagulation profile No results for input(s): INR,  PROTIME in the last 168 hours. ------------------------------------------------------------------------------------------------------------------- No results for input(s): DDIMER in the last 72 hours. -------------------------------------------------------------------------------------------------------------------  Cardiac Enzymes  Recent Labs Lab 09/08/16 1146  TROPONINI <0.03    ------------------------------------------------------------------------------------------------------------------ Invalid input(s): POCBNP  ---------------------------------------------------------------------------------------------------------------  Urinalysis    Component Value Date/Time   COLORURINE YELLOW (A) 08/28/2016 1742   APPEARANCEUR HAZY (A) 08/28/2016 1742   LABSPEC 1.011 08/28/2016 1742   PHURINE 7.0 08/28/2016 1742   GLUCOSEU NEGATIVE 08/28/2016 1742   HGBUR SMALL (A) 08/28/2016 1742   BILIRUBINUR NEGATIVE 08/28/2016 1742   KETONESUR NEGATIVE 08/28/2016 1742   PROTEINUR NEGATIVE 08/28/2016 1742   NITRITE NEGATIVE 08/28/2016 1742   LEUKOCYTESUR SMALL (A) 08/28/2016 1742     RADIOLOGY: Dg Chest Portable 1 View  Result Date: 09/08/2016 CLINICAL DATA:  COPD.  Lung cancer . EXAM: PORTABLE CHEST 1 VIEW COMPARISON:  08/28/2016. FINDINGS: Mass lesion/infiltrate right lung base with atelectasis again noted. Diffuse right lung infiltrate. Right side pleural effusion. No focal infiltrate on the left. Small nodular opacity over the left lung base most likely nipple shadow. No acute bony abnormality. IMPRESSION: Persistent mass lesion/ consolidation right lower lobe with prominent volume loss on the right. Diffuse right lung infiltrate and right pleural effusion. Findings consistent patient's known malignancy. Superimposed pneumonia cannot be excluded. Right lung atelectatic changes have increased from prior exam. Electronically Signed   By: [place]   On: 09/08/2016 12:05    EKG: Orders placed or performed during the hospital encounter of 09/08/16  . ED EKG  . ED EKG  . EKG 12-Lead  . EKG 12-Lead    IMPRESSION AND PLAN: 66 year old female patient with history of metastatic lung cancer, tobacco abuse, schizophrenia, emphysema presented to the emergency room with difficulty breathing. Admitting diagnosis 1. Sepsis 2. Healthcare associated pneumonia 3.  Dehydration 4. Hyponatremia 5. Schizophrenia 6. Emphysema 7. Tobacco abuse Treatment plan Admit patient to medical floor IV fluid hydration Start patient on IV vancomycin and IV cefepime antibiotics Follow-up electrolytes and sodium level Follow-up cultures and lactate level DVT prophylaxis subcutaneous Lovenox 40 MG daily Oxygen via nasal cannula Supportive care  All the records are reviewed and case discussed with ED provider. Management plans discussed with the patient, family and they are in agreement.  CODE STATUS:DNR Surrogate decision maker : Brother    Code Status Orders        Start     Ordered   09/09/16 0231  Do not attempt resuscitation (DNR)  Continuous    Question Answer Comment  In the event of cardiac or respiratory ARREST Do not call a "code blue"   In the event of cardiac or respiratory ARREST Do not perform Intubation, CPR, defibrillation or ACLS   In the event of cardiac or respiratory ARREST Use medication by any route, position, wound care, and other measures to relive pain and suffering. May use oxygen, suction and manual treatment of airway obstruction as needed for comfort.      09/09/16 0232    Code Status History    Date Active Date Inactive Code Status Order ID Comments User Context   08/30/2016 11:18 AM 09/04/2016  5:34 PM DNR [id number]  Hildred Priest, MD Inpatient   08/28/2016 12:30 PM 08/30/2016 11:18 AM Full Code [id number]  Hildred Priest, MD Inpatient    Advance Directive Documentation     Most Recent Value  Type of Advance Directive  Out of facility DNR (pink MOST or yellow form)  Pre-existing out of facility DNR order (yellow form or  pink MOST form)  -  "MOST" Form in Place?  -       TOTAL TIME TAKING CARE OF THIS PATIENT: 52 minutes.    Saundra Shelling M.D on 09/09/2016 at 3:33 AM  Between 7am to 6pm - Pager - [phone]  After 6pm go to [email or web address] - password [email or web address] [facility]  [facility]   Office  [phone]  CC: Primary care physician; No PCP Per Patient

## 2016-09-09 NOTE — Progress Notes (Signed)
Visit made. Patient seen lying in bed, appeared to be sleeping, was just awake prior to visit per safety sitter. She has been admitted for treatment of sepsis related to PNA. Per chart note review she was started on IV antibiotics and IV fluids. Palliative has been reconsulted. Palliative Medicine NP Wadie Lessen has spoken with patient's sister and Daisey Must [phone]) who again confirmed the path of comfort. Patient at this time remains on room air and has not required any PRN medications. Updated notes faxed to triage, hospice team updated. Will continue to follow. Thank you. Flo Shanks RN, BSN, [facility] [place], hospital liaison [phone] c

## 2016-09-09 NOTE — ED Notes (Addendum)
At Dr Saul Fordyce request, the RN attempted to notify pt's family regarding concerns with the pt's MOST form. First call was to the pt's husband Haley Arias); there was only a busy tone at the home number listed [phone]) and no answer. Second call was to the number listed as the husband's cell [phone]) with no answer and unable to leave a VM d/t inbox not being set up. This RN was successful in contacting the pt's brother Haley Arias [phone]) who was already aware of the pt's status and reasons for being in the ED. This RN explained to Mr Haley Arias the reasoning and rationale for requesting IV fluids and IV ABX for the pt, and our inability to do so because of the restrictions placed by the MOST form on her chart. The pt's brother acknowledged understanding and stated that he and his sister Haley Arias, pt representative on MOST form) were in agreement and equally responsible for pt's medical decisions.  Mr. Haley Arias stated that the MOST form was in place more for the pt's "quality at the end of life" issues based on her cancer d/x, rather than exclude IV fluids and IV ABX for her current medical situation. Mr. Haley Arias gave this RN verbal permission to start IV fluids and IV ABX as needed. D/t the moral and legal issues that might arise from disregarding the pt's legally-binding MOST form, Dr. Owens Shark also spoke with Mr Haley Arias at great length to explain the reasons necessary for IV fluids and IV ABX, and the risks associated from withholding them. Dr. Owens Shark provides second verification that Mr. Haley Arias gives verbal permission for the ED staff to begin IV fluids and IV ABX as needed at this time.

## 2016-09-09 NOTE — Progress Notes (Addendum)
Pharmacy Antibiotic Note  Haley Arias is a 66 y.o. female admitted on 09/08/2016 with pneumonia.  Pharmacy has been consulted for ceftriaxone and azithromycin dosing.  Plan: Ceftriaxone 1 gram q 24 hours ordered. Azithromycin 500 mg q 24 hours ordered.  Changed to vancomycin and cefepime  DW 42kg  Vd 29L  kei 0.042 hr-1  t1/2 17 hours Vancomycin 750 mg q 24 hours ordered with stacked dosing. Level before 5th dose. Goal trough 15-20  Cefepime 2 grams q 12 hours ordered.  Temp (24hrs), Avg:96.9 F (36.1 C), Min:96.9 F (36.1 C), Max:96.9 F (36.1 C)   Recent Labs Lab 09/08/16 1146 09/08/16 2244  WBC 22.0*  --   CREATININE 0.83  --   LATICACIDVEN 3.0* 3.2*    Estimated Creatinine Clearance: 45 mL/min (by C-G formula based on SCr of 0.83 mg/dL).    Allergies  Allergen Reactions  . Cogentin [Benztropine] Anxiety    Antimicrobials this admission: ceftriaxone azithromycin 3/21 >> vancomycin and cefepime 3/21   >>   Dose adjustments this admission:   Microbiology results:    Thank you for allowing pharmacy to be a part of this patient's care.  Itzae Miralles S 09/09/2016 3:29 AM

## 2016-09-09 NOTE — ED Notes (Signed)
Pt placed in admitted hospital bed and given sandwich tray by this tech. MD ok'ed pt to eat.

## 2016-09-09 NOTE — ED Notes (Signed)
Patient sitting on side of bed bed alarm going off

## 2016-09-09 NOTE — ED Notes (Addendum)
Notified Dr. Estanislado Pandy of pt's post-fluid bolus VS and pt's ability to maintain O2 sats >97% on RA.

## 2016-09-09 NOTE — Progress Notes (Signed)
[facility] [place] NAME: Haley Arias    MR#:  [id number]  DATE OF BIRTH:  1951-03-03  SUBJECTIVE:  CHIEF COMPLAINT:   Chief Complaint  Patient presents with  . Shortness of Breath   - cheerful and talkative, denies any complaints  REVIEW OF SYSTEMS:  Review of Systems  Unable to perform ROS: Psychiatric disorder    DRUG ALLERGIES:   Allergies  Allergen Reactions  . Cogentin [Benztropine] Anxiety    VITALS:  Blood pressure (!) 92/47, pulse 93, temperature 98.2 F (36.8 C), temperature source Oral, resp. rate 18, SpO2 97 %.  PHYSICAL EXAMINATION:  Physical Exam  GENERAL:  66 y.o.-year-old patient lying in the bed with no acute distress.  EYES: Pupils equal, round, reactive to light and accommodation. No scleral icterus. Extraocular muscles intact.  HEENT: Head atraumatic, normocephalic. Oropharynx and nasopharynx clear.  NECK:  Supple, no jugular venous distention. No thyroid enlargement, no tenderness.  LUNGS: Normal breath sounds bilaterally, no wheezing, rales,rhonchi or crepitation. No use of accessory muscles of respiration.  CARDIOVASCULAR: S1, S2 normal. No murmurs, rubs, or gallops.  ABDOMEN: Soft, nontender, nondistended. Bowel sounds present. No organomegaly or mass.  EXTREMITIES: No pedal edema, cyanosis, or clubbing.  NEUROLOGIC: Cranial nerves II through XII are intact. Muscle strength 5/5 in all extremities. Sensation intact. Gait not checked.  PSYCHIATRIC: The patient is alert and not oriented, schizophrenia. Disorganized thoughts  SKIN: No obvious rash, lesion, or ulcer.    LABORATORY PANEL:   CBC  Recent Labs Lab 09/09/16 0516  WBC 19.9*  HGB 8.8*  HCT 26.9*  PLT 586*   ------------------------------------------------------------------------------------------------------------------  Chemistries   Recent Labs Lab 09/08/16 1146 09/09/16 0516  NA 128* 128*  K 3.8 3.4*  CL 95* 98*  CO2  21* 20*  GLUCOSE 178* 234*  BUN 21* 19  CREATININE 0.83 0.47  CALCIUM 9.3 8.3*  AST 22  --   ALT 25  --   ALKPHOS 220*  --   BILITOT 1.4*  --    ------------------------------------------------------------------------------------------------------------------  Cardiac Enzymes  Recent Labs Lab 09/08/16 1146  TROPONINI <0.03   ------------------------------------------------------------------------------------------------------------------  RADIOLOGY:  Dg Chest Portable 1 View  Result Date: 09/08/2016 CLINICAL DATA:  COPD.  Lung cancer . EXAM: PORTABLE CHEST 1 VIEW COMPARISON:  08/28/2016. FINDINGS: Mass lesion/infiltrate right lung base with atelectasis again noted. Diffuse right lung infiltrate. Right side pleural effusion. No focal infiltrate on the left. Small nodular opacity over the left lung base most likely nipple shadow. No acute bony abnormality. IMPRESSION: Persistent mass lesion/ consolidation right lower lobe with prominent volume loss on the right. Diffuse right lung infiltrate and right pleural effusion. Findings consistent patient's known malignancy. Superimposed pneumonia cannot be excluded. Right lung atelectatic changes have increased from prior exam. Electronically Signed   By: [place]   On: 09/08/2016 12:05    EKG:   Orders placed or performed during the hospital encounter of 09/08/16  . ED EKG  . ED EKG  . EKG 12-Lead  . EKG 12-Lead    ASSESSMENT AND PLAN:   66 year old female with past medical history significant for schizophrenia, recent diagnosis of metastatic lung cancer last week when she was admitted to behavioral medicine unit, tobacco use disorder presents to Hospital from group home secondary to difficulty breathing. She was noted to be hypoxic initially.  #1 acute hypoxic respiratory failure-secondary to right lower lobe pneumonia and pleural effusion. Also has right-sided lung cancer. -Was on  BiPAP in the emergency room, being to nasal  cannula at this time. Continue to wean off oxygen. -Patient on vancomycin and cefepime. Check MRSA PCR. -Unfortunately blood cultures were not drawn in the emergency room. Monitor white count and lactic acid.-  - Narrow antibiotics when appropriate.  #2 chronic anemia-anemia of chronic disease. No acute indication for transfusion. Monitor at this time.  #3 hyponatremia-IV fluids. Hypokalemia-being replaced  #4 schizophrenia-recent admission to behavioral medicine unit last week for 10 days. -Continue all her psychiatric medications. On amantadine, Prolixin, Zoloft and trazodone -Psychiatry consulted.  #5 lung cancer- diagnosed with right lower lobe mass on CT chest last admission last week. Also had weight loss. Seen by oncology and palliative care last admission. Patient not a good candidate for chemotherapy due to compliance issues and mental disorder. And patient did not want to have any chemotherapy or further workup done for her malignancy. -After conversations with the family, patient has decided not to pursue further treatment. She is a DO NOT RESUSCITATE.  #5 DVT prophylaxis-currently on Lovenox  Patient will be a difficult disposition/placement. She does not want to go back to her group home. Will need to have a legal guardian to make decisions for her.     All the records are reviewed and case discussed with Care Management/Social Workerr. Management plans discussed with the patient, family and they are in agreement.  CODE STATUS: DNR  TOTAL TIME TAKING CARE OF THIS PATIENT: 38 minutes.   POSSIBLE D/C IN 2 DAYS, DEPENDING ON CLINICAL CONDITION.   Gladstone Lighter M.D on 09/09/2016 at 3:06 PM  Between 7am to 6pm - Pager - [phone]  After 6pm go to [email or web address] - password [email or web address] [facility]  Office  [phone]  CC: Primary care physician; No PCP Per Patient

## 2016-09-09 NOTE — ED Notes (Signed)
BP cuff removed temporarily per pt request

## 2016-09-09 NOTE — Consult Note (Signed)
[facility] Consult   Reason for Consult:  Consult for 66 year old woman with schizophrenia and lung cancer now admitted to the hospital for sepsis Referring Physician:  Tressia Miners Patient Identification: Haley Arias MRN:  [id number] Principal Diagnosis: Schizophrenia [facility]) Diagnosis:   Patient Active Problem List   Diagnosis Date Noted  . Sepsis ([place]) [A41.9] 09/08/2016  . CAP (community acquired pneumonia) [J18.9] 09/08/2016  . Failure to thrive in adult [R62.7]   . Palliative care by specialist [Z51.5]   . DNR (do not resuscitate) [Z66]   . Lung cancer ([place]) [C34.90]   . Metastasis ([place]) [C79.9]   . Schizophrenia ([place]) [F20.9] 08/28/2016  . COPD (chronic obstructive pulmonary disease) ([place]) [J44.9] 08/28/2016  . Tobacco use disorder [F17.200] 08/28/2016    Total Time spent with patient: 45 minutes  Subjective:   Haley Arias is a 66 y.o. female patient admitted with "I was okay till they brought me here".  HPI:  Patient interviewed chart reviewed. See my note also from yesterday. 66 year old woman with schizophrenia. She was admitted to the psychiatric ward at the beginning of the month and stabilized and then discharged to a group home. She stayed there only a few days before she insisted on coming back to the emergency room and it appeared that she had burned her bridges with the people back at the group home. On top of this she had been diagnosed with lung cancer which appears to be pretty advanced when she was on the psychiatric ward but refuses to believe that it is true. She was in the emergency room yesterday because she was refusing to stay at her group home but then was diagnosed as being septic and admitted to medicine. On interview today the patient looks more sickly. She says she is feeling sick and tired and run down. Denies mood symptoms. Denies hallucinations. Denies suicidal ideation. Has been compliant with medicine.  Social history: She does not  have a legal guardian. She has siblings who have been trying to take some responsibility for her care but she has been resistant.  Medical history: Recent diagnosis of lung cancer. Patient refuses to believe in it. Now septic as well.  Substance abuse history: None  Past Psychiatric History: Patient has a long-standing history of schizophrenia going back to early adulthood. Had apparently been able to stay out of hospitals for years after an early period of psychiatric hospitalization and was being maintained on Prolixin but was starting to have more trouble taking care of herself down in [place].  Risk to Self: Suicidal Ideation: No Suicidal Intent: No Is patient at risk for suicide?: No Suicidal Plan?: No Access to Means: No What has been your use of drugs/alcohol within the last 12 months?: Reports of none How many times?: 0 Other Self Harm Risks: Reports of none Triggers for Past Attempts: None known Intentional Self Injurious Behavior: None Risk to Others: Homicidal Ideation: No Thoughts of Harm to Others: No Current Homicidal Intent: No Current Homicidal Plan: No Access to Homicidal Means: No Identified Victim: Reports of none History of harm to others?: No Assessment of Violence: None Noted Violent Behavior Description: Reports of none Does patient have access to weapons?: No Does patient have a court date: No Prior Inpatient Therapy: Prior Inpatient Therapy: Yes Prior Therapy Dates: 08/2016, 05/2002, 06/2001, 12/2000 & 09/2000 Prior Therapy Facilty/Provider(s): [facility], [facility] Reason for Treatment: Schizophrenia Prior Outpatient Therapy: Prior Outpatient Therapy: Yes Prior Therapy Dates: Current Prior Therapy Facilty/Provider(s): [facility]  [facility] Reason for Treatment: Schizophrenia Does patient have an ACCT team?: No Does patient have [facility] services? : No Does patient have [facility] services?: No  Past Medical History:  Past Medical History:   Diagnosis Date  . Cancer ([place])   . COPD (chronic obstructive pulmonary disease) ([place])   . Schizophrenia ([place])   . Tobacco use     Past Surgical History:  Procedure Laterality Date  . NO PAST SURGERIES    . none     Family History:  Family History  Problem Relation Age of Onset  . Bone cancer Father    Family Psychiatric  History: None Social History:  History  Alcohol Use No     History  Drug Use No    Social History   Social History  . Marital status: Married    Spouse name: N/A  . Number of children: N/A  . Years of education: N/A   Social History Main Topics  . Smoking status: Current Every Day Smoker    Packs/day: 1.00    Types: Cigarettes  . Smokeless tobacco: Current User  . Alcohol use No  . Drug use: No  . Sexual activity: Not Asked   Other Topics Concern  . None   Social History Narrative  . None   Additional Social History:    Allergies:   Allergies  Allergen Reactions  . Cogentin [Benztropine] Anxiety    Labs:  Results for orders placed or performed during the hospital encounter of 09/08/16 (from the past 48 hour(s))  Troponin I     Status: None   Collection Time: 09/08/16 11:46 AM  Result Value Ref Range   Troponin I <0.03 <0.03 ng/mL  CBC     Status: Abnormal   Collection Time: 09/08/16 11:46 AM  Result Value Ref Range   WBC 22.0 (H) 3.6 - 11.0 K/uL   RBC 4.19 3.80 - 5.20 MIL/uL   Hemoglobin 10.4 (L) 12.0 - 16.0 g/dL   HCT 31.8 (L) 35.0 - 47.0 %   MCV 75.9 (L) 80.0 - 100.0 fL   MCH 24.9 (L) 26.0 - 34.0 pg   MCHC 32.8 32.0 - 36.0 g/dL   RDW 20.8 (H) 11.5 - 14.5 %   Platelets 587 (H) 150 - 440 K/uL  Comprehensive metabolic panel     Status: Abnormal   Collection Time: 09/08/16 11:46 AM  Result Value Ref Range   Sodium 128 (L) 135 - 145 mmol/L   Potassium 3.8 3.5 - 5.1 mmol/L   Chloride 95 (L) 101 - 111 mmol/L   CO2 21 (L) 22 - 32 mmol/L   Glucose, Bld 178 (H) 65 - 99 mg/dL   BUN 21 (H) 6 - 20 mg/dL   Creatinine, Ser 0.83  0.44 - 1.00 mg/dL   Calcium 9.3 8.9 - 10.3 mg/dL   Total Protein 8.2 (H) 6.5 - 8.1 g/dL   Albumin 2.7 (L) 3.5 - 5.0 g/dL   AST 22 15 - 41 U/L   ALT 25 14 - 54 U/L   Alkaline Phosphatase 220 (H) 38 - 126 U/L   Total Bilirubin 1.4 (H) 0.3 - 1.2 mg/dL   GFR calc non Af Amer >60 >60 mL/min   GFR calc Af Amer >60 >60 mL/min    Comment: (NOTE) The eGFR has been calculated using the CKD EPI equation. This calculation has not been validated in all clinical situations. eGFR's persistently <60 mL/min signify possible Chronic Kidney Disease.    Anion gap 12 5 - 15  Lactic acid, plasma     Status: Abnormal   Collection Time: 09/08/16 11:46 AM  Result Value Ref Range   Lactic Acid, Venous 3.0 (HH) 0.5 - 1.9 mmol/L    Comment: CRITICAL RESULT CALLED TO, READ BACK BY AND VERIFIED WITH KATE BOOMGARNER@1229  ON 09/08/16 BY HKP   Blood gas, venous     Status: Abnormal   Collection Time: 09/08/16 11:49 AM  Result Value Ref Range   FIO2 1.00    Delivery systems BILEVEL POSITIVE AIRWAY PRESSURE    Peep/cpap 5.0 cm H20   Pressure support 10 cm H20   pH, Ven 7.39 7.250 - 7.430   pCO2, Ven 38 (L) 44.0 - 60.0 mmHg   pO2, Ven <31.0 (LL) 32.0 - 45.0 mmHg    Comment: CRITICAL RESULT CALLED TO, READ BACK BY AND VERIFIED WITH:  MD VERONESE AT 1212, 09/08/2016, LS    Bicarbonate 23.0 20.0 - 28.0 mmol/L   Acid-base deficit 1.7 0.0 - 2.0 mmol/L   Patient temperature 37.0    Collection site VENOUS    Sample type VENOUS    Mechanical Rate 8   Lactic acid, plasma     Status: Abnormal   Collection Time: 09/08/16 10:44 PM  Result Value Ref Range   Lactic Acid, Venous 3.2 (HH) 0.5 - 1.9 mmol/L    Comment: CRITICAL RESULT CALLED TO, READ BACK BY AND VERIFIED WITH BUTCH WOODS ON 09/08/16 AT 2341 BY TLB   Lactic acid, plasma     Status: Abnormal   Collection Time: 09/09/16  3:25 AM  Result Value Ref Range   Lactic Acid, Venous 3.0 (HH) 0.5 - 1.9 mmol/L    Comment: CRITICAL RESULT CALLED TO, READ BACK BY AND  VERIFIED WITH IRIS GUIDRY ON 09/09/16 AT 0426 BY TLB   Basic metabolic panel     Status: Abnormal   Collection Time: 09/09/16  5:16 AM  Result Value Ref Range   Sodium 128 (L) 135 - 145 mmol/L   Potassium 3.4 (L) 3.5 - 5.1 mmol/L   Chloride 98 (L) 101 - 111 mmol/L   CO2 20 (L) 22 - 32 mmol/L   Glucose, Bld 234 (H) 65 - 99 mg/dL   BUN 19 6 - 20 mg/dL   Creatinine, Ser 0.47 0.44 - 1.00 mg/dL   Calcium 8.3 (L) 8.9 - 10.3 mg/dL   GFR calc non Af Amer >60 >60 mL/min   GFR calc Af Amer >60 >60 mL/min    Comment: (NOTE) The eGFR has been calculated using the CKD EPI equation. This calculation has not been validated in all clinical situations. eGFR's persistently <60 mL/min signify possible Chronic Kidney Disease.    Anion gap 10 5 - 15  CBC     Status: Abnormal   Collection Time: 09/09/16  5:16 AM  Result Value Ref Range   WBC 19.9 (H) 3.6 - 11.0 K/uL   RBC 3.55 (L) 3.80 - 5.20 MIL/uL   Hemoglobin 8.8 (L) 12.0 - 16.0 g/dL   HCT 26.9 (L) 35.0 - 47.0 %   MCV 75.6 (L) 80.0 - 100.0 fL   MCH 24.8 (L) 26.0 - 34.0 pg   MCHC 32.8 32.0 - 36.0 g/dL   RDW 20.4 (H) 11.5 - 14.5 %   Platelets 586 (H) 150 - 440 K/uL  Lactic acid, plasma     Status: Abnormal   Collection Time: 09/09/16  6:21 AM  Result Value Ref Range   Lactic Acid, Venous 2.6 (HH) 0.5 - 1.9 mmol/L  Comment: CRITICAL RESULT CALLED TO, READ BACK BY AND VERIFIED WITH BUTCH WOODS 09/09/16 0707 SGD   MRSA PCR Screening     Status: None   Collection Time: 09/09/16  2:33 PM  Result Value Ref Range   MRSA by PCR NEGATIVE NEGATIVE    Comment:        The GeneXpert MRSA Assay (FDA approved for NASAL specimens only), is one component of a comprehensive MRSA colonization surveillance program. It is not intended to diagnose MRSA infection nor to guide or monitor treatment for MRSA infections.     Current Facility-Administered Medications  Medication Dose Route Frequency Provider Last Rate Last Dose  . 0.9 %  sodium chloride  infusion  250 mL Intravenous PRN Saundra Shelling, MD      . acetaminophen (TYLENOL) tablet 650 mg  650 mg Oral Q6H PRN Saundra Shelling, MD       Or  . acetaminophen (TYLENOL) suppository 650 mg  650 mg Rectal Q6H PRN Pavan Pyreddy, MD      . albuterol (PROVENTIL HFA;VENTOLIN HFA) 108 (90 Base) MCG/ACT inhaler 1-2 puff  1-2 puff Inhalation Q4H PRN Pavan Pyreddy, MD      . amantadine (SYMMETREL) capsule 100 mg  100 mg Oral QHS Pavan Pyreddy, MD      . feeding supplement (ENSURE ENLIVE) (ENSURE ENLIVE) liquid 237 mL  237 mL Oral TID PC & HS Pavan Pyreddy, MD   237 mL at 09/09/16 1800  . fluPHENAZine (PROLIXIN) tablet 5 mg  5 mg Oral QHS Pavan Pyreddy, MD      . haloperidol lactate (HALDOL) injection 2 mg  2 mg Intravenous Q6H PRN Knox Royalty, NP      . levothyroxine (SYNTHROID, LEVOTHROID) tablet 75 mcg  75 mcg Oral QAC breakfast Saundra Shelling, MD   75 mcg at 09/09/16 0943  . LORazepam (ATIVAN) tablet 0.5 mg  0.5 mg Oral Q8H PRN Pavan Pyreddy, MD      . LORazepam (ATIVAN) tablet 0.5 mg  0.5 mg Oral QHS Pavan Pyreddy, MD      . mometasone-formoterol (DULERA) 200-5 MCG/ACT inhaler 2 puff  2 puff Inhalation BID Saundra Shelling, MD   2 puff at 09/09/16 0945  . morphine 2 MG/ML injection 1 mg  1 mg Intravenous Q1H PRN Knox Royalty, NP      . OLANZapine (ZYPREXA) tablet 7.5 mg  7.5 mg Oral Daily Pavan Pyreddy, MD   7.5 mg at 09/09/16 1034  . ondansetron (ZOFRAN) tablet 4 mg  4 mg Oral Q6H PRN Saundra Shelling, MD       Or  . ondansetron (ZOFRAN) injection 4 mg  4 mg Intravenous Q6H PRN Pavan Pyreddy, MD      . senna-docusate (Senokot-S) tablet 1 tablet  1 tablet Oral QHS PRN Saundra Shelling, MD      . sertraline (ZOLOFT) tablet 100 mg  100 mg Oral Daily Pavan Pyreddy, MD   100 mg at 09/09/16 1034  . sodium chloride flush (NS) 0.9 % injection 3 mL  3 mL Intravenous Q12H Pavan Pyreddy, MD      . sodium chloride flush (NS) 0.9 % injection 3 mL  3 mL Intravenous Q12H Pavan Pyreddy, MD      . sodium chloride flush  (NS) 0.9 % injection 3 mL  3 mL Intravenous PRN Pavan Pyreddy, MD      . traZODone (DESYREL) tablet 50 mg  50 mg Oral QHS Saundra Shelling, MD        Musculoskeletal:  Strength & Muscle Tone: decreased Gait & Station: unable to stand Patient leans: N/A  Psychiatric Specialty Exam: Physical Exam  Nursing note and vitals reviewed. Constitutional: She appears well-developed.  HENT:  Head: Normocephalic and atraumatic.  Eyes: Conjunctivae are normal. Pupils are equal, round, and reactive to light.  Neck: Normal range of motion.  Cardiovascular: Regular rhythm and normal heart sounds.   Respiratory: She is in respiratory distress.  GI: Soft.  Musculoskeletal: Normal range of motion.  Neurological: She is alert.  Skin: Skin is warm and dry.  Psychiatric: Her affect is blunt. Her speech is delayed. She is slowed. Thought content is paranoid. She expresses impulsivity. She expresses no suicidal ideation. She exhibits abnormal recent memory.    Review of Systems  Constitutional: Positive for malaise/fatigue and weight loss.  HENT: Negative.   Eyes: Negative.   Respiratory: Negative.   Cardiovascular: Negative.   Gastrointestinal: Negative.   Musculoskeletal: Negative.   Skin: Negative.   Neurological: Positive for weakness.  Psychiatric/Behavioral: Positive for memory loss. Negative for depression, hallucinations, substance abuse and suicidal ideas. The patient is nervous/anxious. The patient does not have insomnia.     Blood pressure (!) 92/47, pulse 93, temperature 98.2 F (36.8 C), temperature source Oral, resp. rate 18, height (P) 5' 2"  (1.575 m), weight (P) 44.4 kg (97 lb 14.4 oz), SpO2 97 %.Body mass index is 17.91 kg/m (pended).  General Appearance: Disheveled  Eye Contact:  Fair  Speech:  Slow  Volume:  Decreased  Mood:  Dysphoric  Affect:  Constricted  Thought Process:  Goal Directed  Orientation:  Full (Time, Place, and Person)  Thought Content:  Illogical  Suicidal  Thoughts:  No  Homicidal Thoughts:  No  Memory:  Immediate;   Fair Recent;   Poor Remote;   Poor  Judgement:  Poor  Insight:  Shallow  Psychomotor Activity:  Decreased  Concentration:  Concentration: Poor  Recall:  Poor  Fund of Knowledge:  Poor  Language:  Poor  Akathisia:  Yes  Handed:  Right  AIMS (if indicated):     Assets:  Social Support  ADL's:  Impaired  Cognition:  Impaired,  Mild  Sleep:        Treatment Plan Summary: Daily contact with patient to assess and evaluate symptoms and progress in treatment, Medication management and Plan Patient was schizophrenia now with advanced medical problems as well. Currently behaving okay. Probably at about her baseline mental state. Continue Prolixin 5 mg at night. At some point she might need to get a decanoate shot again. I will continue to follow-up as needed.  Disposition: Supportive therapy provided about ongoing stressors.  Alethia Berthold, MD 09/09/2016 7:05 PM

## 2016-09-09 NOTE — ED Notes (Signed)
Attempted to give patient a lunch tray but she refused. Tray was left in the room.

## 2016-09-10 MED ORDER — LORAZEPAM 1 MG PO TABS
1.0000 mg | ORAL_TABLET | Freq: Four times a day (QID) | ORAL | Status: DC | PRN
  Administered 2016-09-20: 1 mg via ORAL
  Filled 2016-09-10: qty 1

## 2016-09-10 MED ORDER — ENSURE ENLIVE PO LIQD: 237.0000 mL | Freq: Three times a day (TID) | ORAL | Status: DC | PRN

## 2016-09-10 MED ORDER — DEXTROSE 5 % IV SOLN
2.0000 g | Freq: Two times a day (BID) | INTRAVENOUS | Status: DC
  Filled 2016-09-10: qty 2

## 2016-09-10 NOTE — Progress Notes (Signed)
[facility] [place] NAME: Haley Arias    MR#:  [id number]  DATE OF BIRTH:  09-06-1950  SUBJECTIVE: Admitted for pneumonia, has history of lung cancer. On comfort measures.   CHIEF COMPLAINT:   Chief Complaint  Patient presents with  . Shortness of Breath   - cheerful and talkative, denies any complaints  REVIEW OF SYSTEMS:  Review of Systems  Unable to perform ROS: Psychiatric disorder    DRUG ALLERGIES:   Allergies  Allergen Reactions  . Cogentin [Benztropine] Anxiety    VITALS:  Blood pressure (!) 102/58, pulse (!) 112, temperature 98.2 F (36.8 C), temperature source Oral, resp. rate 20, height '5\' 2"'$  (1.575 m), weight 44.4 kg (97 lb 14.4 oz), SpO2 92 %.  PHYSICAL EXAMINATION:  Physical Exam  GENERAL:  66 y.o.-year-old patient lying in the bed with no acute distress.  EYES: Pupils equal, round, reactive to light and accommodation. No scleral icterus. Extraocular muscles intact.  HEENT: Head atraumatic, normocephalic. Oropharynx and nasopharynx clear.  NECK:  Supple, no jugular venous distention. No thyroid enlargement, no tenderness.  LUNGS: Normal breath sounds bilaterally, no wheezing, rales,rhonchi or crepitation. No use of accessory muscles of respiration.  CARDIOVASCULAR: S1, S2 normal. No murmurs, rubs, or gallops.  ABDOMEN: Soft, nontender, nondistended. Bowel sounds present. No organomegaly or mass.  EXTREMITIES: No pedal edema, cyanosis, or clubbing.  NEUROLOGIC: Cranial nerves II through XII are intact. Muscle strength 5/5 in all extremities. Sensation intact. Gait not checked.  PSYCHIATRIC: The patient is alert and not oriented, schizophrenia. Disorganized thoughts  SKIN: No obvious rash, lesion, or ulcer.    LABORATORY PANEL:   CBC  Recent Labs Lab 09/09/16 0516  WBC 19.9*  HGB 8.8*  HCT 26.9*  PLT 586*    ------------------------------------------------------------------------------------------------------------------  Chemistries   Recent Labs Lab 09/08/16 1146 09/09/16 0516  NA 128* 128*  K 3.8 3.4*  CL 95* 98*  CO2 21* 20*  GLUCOSE 178* 234*  BUN 21* 19  CREATININE 0.83 0.47  CALCIUM 9.3 8.3*  AST 22  --   ALT 25  --   ALKPHOS 220*  --   BILITOT 1.4*  --    ------------------------------------------------------------------------------------------------------------------  Cardiac Enzymes  Recent Labs Lab 09/08/16 1146  TROPONINI <0.03   ------------------------------------------------------------------------------------------------------------------  RADIOLOGY:  No results found.  EKG:   Orders placed or performed during the hospital encounter of 09/08/16  . ED EKG  . ED EKG  . EKG 12-Lead  . EKG 12-Lead    ASSESSMENT AND PLAN:   66 year old female with past medical history significant for schizophrenia, recent diagnosis of metastatic lung cancer last week when she was admitted to behavioral medicine unit, tobacco use disorder presents to Hospital from group home secondary to difficulty breathing. She was noted to be hypoxic initially.  #1 acute hypoxic respiratory failure-secondary to right lower lobe pneumonia and pleural effusion. Also has right-sided lung cancer. -Continue comfort care measures, prognosis is poor.  #2 chronic anemia-anemia of chronic disease. No acute indication for transfusion.  #3 hyponatremia-I Hypokalemia-  #4 schizophrenia-recent admission to behavioral medicine unit last week for 10 days. #5 lung cancer- diagnosed with right lower lobe mass on CT chest last admission last week. Also had weight loss. Seen by oncology and palliative care last admission. Patient not a good candidate for chemotherapy due to compliance issues and mental disorder. And patient did not want to have any chemotherapy or further workup done for her  malignancy. -After  conversations with the family, patient has decided not to pursue further treatment. She is a DO NOT RESUSCITATE.  continue comfort care measures, if she continues to declines tomorrow patient may go to hospice home. #5 DVT prophylaxis-currently on Lovenox     All the records are reviewed and case discussed with Care Management/Social Workerr. Management plans discussed with the patient, family and they are in agreement.  CODE STATUS: DNR  TOTAL TIME TAKING CARE OF THIS PATIENT: 38 minutes.   POSSIBLE D/C IN 2 DAYS, DEPENDING ON CLINICAL CONDITION.   Epifanio Lesches M.D on 09/10/2016 at 2:35 PM  Between 7am to 6pm - Pager - [phone]  After 6pm go to [email or web address] - password [email or web address] [facility]  Office  [phone]  CC: Primary care physician; No PCP Per Patient

## 2016-09-10 NOTE — Progress Notes (Signed)
Visit made. Patient seen lying in bed, easily awakened by voice and gentle touch. Denied any pain or discomfort. She continues to sleep most of the day, drinking ensure and water sips. Does get dyspneic with any exertion. IV antibiotics and fluids have been discontinued. 1:1 sitter remains. Will continue to follow and update hospice team. Thank you. Flo Shanks RN, BSN, [place] [facility] liaison [phone]

## 2016-09-10 NOTE — Progress Notes (Signed)
Nutrition Brief Note  Patient identified to be seen for low BMI. Chart reviewed and discussed with RN. Pt now transitioning to comfort care.   Patient already ordered for Ensure Enlive TID, will change order to PRN. No nutrition interventions warranted at this time. Please consult Dietitian as needed.   Willey Blade, MS, RD, LDN Pager: [phone] After Hours Pager: [phone]

## 2016-09-10 NOTE — Progress Notes (Signed)
Daily Progress Note   Patient Name: Haley Arias       Date: 09/10/2016 DOB: Jul 20, 1950  Age: 66 y.o. MRN#: [id number] Attending Physician: Haley Lesches, MD Primary Care Physician: No PCP Per Patient Admit Date: 09/08/2016  Reason for Consultation/Follow-up: Establishing goals of care, Non pain symptom management, Pain control and Psychosocial/spiritual support  Subjective  -patient is resting comfortably, sleeping most of the day per sitter, taking only fluids.  No life prolong measures.  I suspect she will decline significantly over the next 24-48 hrs then being eligible for a hospice facility.  - she is curretnly IVC'd  - I called her sister Haley Arias and she totally supports a full comfort path, offered emotional  support  -natural trajectory and expectation at EOL dicussed, questions and concerns addressed  Length of Stay: 2  Current Medications: Scheduled Meds:  . amantadine  100 mg Oral QHS  . fluPHENAZine  5 mg Oral QHS  . levothyroxine  75 mcg Oral QAC breakfast  . LORazepam  0.5 mg Oral QHS  . mometasone-formoterol  2 puff Inhalation BID  . OLANZapine  7.5 mg Oral Daily  . sertraline  100 mg Oral Daily  . sodium chloride flush  3 mL Intravenous Q12H  . sodium chloride flush  3 mL Intravenous Q12H  . traZODone  50 mg Oral QHS    Continuous Infusions:   PRN Meds: sodium chloride, acetaminophen **OR** acetaminophen, albuterol, feeding supplement (ENSURE ENLIVE), haloperidol lactate, LORazepam, morphine injection, ondansetron **OR** ondansetron (ZOFRAN) IV, senna-docusate, sodium chloride flush  Physical Exam  Constitutional: She appears lethargic. She appears cachectic. She appears ill.  Cardiovascular: Tachycardia present.   Pulmonary/Chest: She has decreased  breath sounds in the right lower field and the left lower field.  Neurological: She appears lethargic.  Skin: Skin is warm and dry.            Vital Signs: BP 93/60 (BP Location: Left Arm)   Pulse (!) 117   Temp 98.7 F (37.1 C) (Oral)   Resp (!) 22   Ht '5\' 2"'$  (1.575 m) Comment: stated by pt.  Wt 44.4 kg (97 lb 14.4 oz)   SpO2 91%   BMI 17.91 kg/m  SpO2: SpO2: 91 % O2 Device: O2 Device: Not Delivered O2 Flow Rate:    Intake/output summary:  Intake/Output  Summary (Last 24 hours) at 09/10/16 1144 Last data filed at 09/10/16 0800  Gross per 24 hour  Intake                0 ml  Output                0 ml  Net                0 ml   LBM: Last BM Date:  (unknown) Baseline Weight: Weight: 44.4 kg (97 lb 14.4 oz) Most recent weight: Weight: 44.4 kg (97 lb 14.4 oz)       Palliative Assessment/Data: 40 % at best      Patient Active Problem List   Diagnosis Date Noted  . Dyspnea   . Sepsis ([place]) 09/08/2016  . CAP (community acquired pneumonia) 09/08/2016  . Failure to thrive in adult   . Palliative care by specialist   . DNR (do not resuscitate)   . Primary malignant neoplasm of right lung metastatic to other site [facility])   . Metastasis ([place])   . Schizophrenia ([place]) 08/28/2016  . COPD (chronic obstructive pulmonary disease) ([place]) 08/28/2016  . Tobacco use disorder 08/28/2016    Palliative Care Assessment & Plan   Patient Profile:  66 y.o. female   admitted on 09/08/2016 with  a known history of Schizophrenia metastatic lung cancer, emphysema, tobacco abuse presented to the emergency room with difficulty breathing.  She is DO NOT RESUSCITATE by CODE STATUS and does not have any medical capacity for making decision during the last presentation.  Focus of care established on last admission    Focus is full comfort  -see my notes from 09-02-16 and 09-03-16   This situation was addressed on last admission.   Although there was no documented HPOA and her husband's  whereabouts are unknown and is not available and they have been estranged for many years, her  sister and brother are acting in her best interest.  - see MOST form in hard chart  Assessment:  - Full comfort, expect rapid decline  Recommendations/Plan:  Morphine/Ativan/Haldol for symptom management   Goals of Care and Additional Recommendations:  Limitations on Scope of Treatment: Full Comfort Care  Code Status:    Code Status Orders        Start     Ordered   09/09/16 0231  Do not attempt resuscitation (DNR)  Continuous    Question Answer Comment  In the event of cardiac or respiratory ARREST Do not call a "code blue"   In the event of cardiac or respiratory ARREST Do not perform Intubation, CPR, defibrillation or ACLS   In the event of cardiac or respiratory ARREST Use medication by any route, position, wound care, and other measures to relive pain and suffering. May use oxygen, suction and manual treatment of airway obstruction as needed for comfort.      09/09/16 0232    Code Status History    Date Active Date Inactive Code Status Order ID Comments User Context   08/30/2016 11:18 AM 09/04/2016  5:34 PM DNR [id number]  Hildred Priest, MD Inpatient   08/28/2016 12:30 PM 08/30/2016 11:18 AM Full Code [id number]  Hildred Priest, MD Inpatient    Advance Directive Documentation     Most Recent Value  Type of Advance Directive  Healthcare Power of Attorney, Out of facility DNR (pink MOST or yellow form)  Pre-existing out of facility DNR order (yellow form or pink MOST form)  -  "  MOST" Form in Place?  -       Prognosis:   < 2 weeks  Discharge Planning:  Hospice facility  Care plan was discussed with Dr Vianne Bulls  Thank you for allowing the Palliative Medicine Team to assist in the care of this patient.   Time In:  1040 Time Out: 1105 Total Time 25 min Prolonged Time Billed  no       Greater than 50%  of this time was spent counseling and  coordinating care related to the above assessment and plan.  Wadie Lessen, NP  Please contact Palliative Medicine Team phone at [phone] for questions and concerns.

## 2016-09-10 NOTE — Clinical Social Work Note (Signed)
Clinical Social Work Assessment  Patient Details  Name: Haley Arias MRN: [id number] Date of Birth: 1951-04-07  Date of referral:  09/10/16               Reason for consult:  Facility Placement, Discharge Planning                Permission sought to share information with:  Family Supports Permission granted to share information::  Yes, Verbal Permission Granted  Name::     Milta Deiters  Relationship::  brother  Contact Information:  [phone]  Housing/Transportation Living arrangements for the past 2 months:  [place] of Information:  Power of [place], Other (Comment Required) Patient Interpreter Needed:  None Criminal Activity/Legal Involvement Pertinent to Current Situation/Hospitalization:  No - Comment as needed Significant Relationships:  Siblings Lives with:  Facility Resident Do you feel safe going back to the place where you live?  No Need for family participation in patient care:  Yes (Comment)  Care giving concerns:  Pt is need of a higher level of care.   Social Worker assessment / plan:  CSW met with pt's brother to address consult. Pt was admitted from [place]. Pt has been made comfort care. Pt is open to [facility]. Pt's brother is aware and agreeable to discharge plan. CSW will continue to follow.   Employment status:  Retired Nurse, adult PT Recommendations:  Not assessed at this time Information / Referral to community resources:  Other (Comment Required) (Hospice)  Patient/Family's Response to care:  Pt's brother was appreciative of CSW support.   Patient/Family's Understanding of and Emotional Response to Diagnosis, Current Treatment, and Prognosis:  Pt's brother understands pr's prognosis.   Emotional Assessment Appearance:  Appears stated age Attitude/Demeanor/Rapport:  Other Affect (typically observed):  Other Orientation:  Oriented to Self, Oriented to Place Alcohol /  Substance use:  Not Applicable Psych involvement (Current and /or in the community):  Yes (Comment)  Discharge Needs  Concerns to be addressed:  Adjustment to Illness, Discharge Planning Concerns, Mental Health Concerns Readmission within the last 30 days:  Yes Current discharge risk:  Terminally ill Barriers to Discharge:  Requiring sitter/restraints, Continued Medical Work up   [employer], LCSW 09/10/2016, 2:57 PM

## 2016-09-10 NOTE — Progress Notes (Signed)
Pharmacy Antibiotic Note  Haley Arias is a 66 y.o. female admitted on 09/08/2016 with pneumonia.  Pharmacy has been consulted cefepime dosing  Plan: Will continue cefepime 2gm IV every 12 hours.   DW 42kg  Vd 29L Height: '5\' 2"'$  (157.5 cm) (stated by pt.) Weight: 97 lb 14.4 oz (44.4 kg) IBW/kg (Calculated) : 50.1kei 0.042 hr-1  t1/2 17 hours Vancomycin 750 mg q 24 hours ordered with stacked dosing. Level before 5th dose. Goal trough 15-20  Cefepime 2 grams q 12 hours ordered.  Temp (24hrs), Avg:98.3 F (36.8 C), Min:97.9 F (36.6 C), Max:98.7 F (37.1 C)   Recent Labs Lab 09/08/16 1146 09/08/16 2244 09/09/16 0325 09/09/16 0516 09/09/16 0621  WBC 22.0*  --   --  19.9*  --   CREATININE 0.83  --   --  0.47  --   LATICACIDVEN 3.0* 3.2* 3.0*  --  2.6*    Estimated Creatinine Clearance: 49.1 mL/min (by C-G formula based on SCr of 0.47 mg/dL).    Allergies  Allergen Reactions  . Cogentin [Benztropine] Anxiety    Antimicrobials this admission: ceftriaxone azithromycin 3/21 >> vancomycin and cefepime 3/21   >>   Dose adjustments this admission:   Microbiology results: MRSA PCR: negative    Thank you for allowing pharmacy to be a part of this patient's care.  Pernell Dupre, PharmD, BCPS Clinical Pharmacist 09/10/2016 1:05 PM

## 2016-09-11 DIAGNOSIS — Z515 Encounter for palliative care: Secondary | ICD-10-CM

## 2016-09-11 DIAGNOSIS — C3491 Malignant neoplasm of unspecified part of right bronchus or lung: Secondary | ICD-10-CM

## 2016-09-11 MED ORDER — GUAIFENESIN-DM 100-10 MG/5ML PO SYRP
5.0000 mL | ORAL_SOLUTION | ORAL | Status: DC | PRN
  Administered 2016-09-11 – 2016-09-17 (×5): 5 mL via ORAL
  Filled 2016-09-11 (×8): qty 5

## 2016-09-11 NOTE — Progress Notes (Signed)
Daily Progress Note   Patient Name: Haley Arias       Date: 09/11/2016 DOB: [birth year]  Age: 66 y.o. MRN#: [id number] Attending Physician: Epifanio Lesches, MD Primary Care Physician: No PCP Per Patient Admit Date: 09/08/2016  Reason for Consultation/Follow-up: Hospice Evaluation, Non pain symptom management, Pain control and Psychosocial/spiritual support  Subjective: Patient is alert, sitting up, not on oxygen.  Cooperative.  Just finished eating her entire breakfast. She has no complaints.  Smiling.   Assessment: 66 yo with schizophrenia and metastatic lung cancer she is currently under hospice care as she has refused therapy for her cancer.  Admitted with sepsis and HCAP.  Required bipap.  Now appears improved, comfortable and in no distress.   Patient Profile/HPI: 66 y.o.femaleadmitted on 3/20/2018with a known history of Schizophrenia metastatic lung cancer, emphysema, tobacco abuse presented to the emergency room with difficulty breathing.  She is DO NOT RESUSCITATE by CODE STATUS and does not have any medical capacity for making decision during the last presentation.  Focus of care established on last admission    Focus is full comfort  This situation was addressed on last admission. Although there was no documented HPOA and her husband's whereabouts are unknown and is not available and they have been estranged for many years, her sister and brother are acting in her best interest.  - see MOST form in hard chart   Length of Stay: 3  Current Medications: Scheduled Meds:  . amantadine  100 mg Oral QHS  . fluPHENAZine  5 mg Oral QHS  . OLANZapine  7.5 mg Oral Daily  . sertraline  100 mg Oral Daily  . sodium chloride flush  3 mL Intravenous Q12H  . sodium  chloride flush  3 mL Intravenous Q12H  . traZODone  50 mg Oral QHS    Continuous Infusions:   PRN Meds: sodium chloride, acetaminophen **OR** acetaminophen, guaiFENesin-dextromethorphan, haloperidol lactate, LORazepam, morphine injection, ondansetron **OR** ondansetron (ZOFRAN) IV, senna-docusate, sodium chloride flush  Physical Exam        Well developed female,  A&O, cooperative, NAD CV rrr Resp NAD  Vital Signs: BP (!) 99/58 (BP Location: Left Arm)   Pulse (!) 115   Temp 97.9 F (36.6 C) (Oral)   Resp 16   Ht '5\' 2"'$  (1.575 m) Comment: stated by pt.  Wt 44.4 kg (97 lb 14.4 oz)   SpO2 97%   BMI 17.91 kg/m  SpO2: SpO2: 97 % O2 Device: O2 Device: Not Delivered O2 Flow Rate:    Intake/output summary:   Intake/Output Summary (Last 24 hours) at 09/11/16 1002 Last data filed at 09/11/16 0300  Gross per 24 hour  Intake              240 ml  Output                0 ml  Net              240 ml   LBM: Last BM Date:  (unknown) Baseline Weight: Weight: 44.4 kg (97 lb 14.4 oz) Most recent weight: Weight: 44.4 kg (97 lb 14.4 oz)       Palliative Assessment/Data:    Flowsheet Rows     Most Recent Value  Intake Tab  Clinical Assessment  Palliative Performance Scale Score  50%  Psychosocial & Spiritual Assessment  Palliative Care Outcomes      Patient Active Problem List   Diagnosis Date Noted  . Dyspnea   . Sepsis ([place]) 09/08/2016  . CAP (community acquired pneumonia) 09/08/2016  . Failure to thrive in adult   . Palliative care by specialist   . DNR (do not resuscitate)   . Primary malignant neoplasm of right lung metastatic to other site [facility])   . Metastasis ([place])   . Schizophrenia ([place]) 08/28/2016  . COPD (chronic obstructive pulmonary disease) ([place]) 08/28/2016  . Tobacco use disorder 08/28/2016    Palliative Care Plan    Recommendations/Plan:  Would continue with Hospice Services outpatient.  Patient doing well on comfort medications and current  psychotropics.  Goals of Care and Additional Recommendations:  Limitations on Scope of Treatment: Full Comfort Care  Code Status:  DNR  Prognosis:   < 3 months secondary to  Metastatic lung cancer on comfort care.  Discharge Planning:  To Be Determined  Care plan was discussed with case manager.  Thank you for allowing the Palliative Medicine Team to assist in the care of this patient.  Total time spent:  25 min.     Greater than 50%  of this time was spent counseling and coordinating care related to the above assessment and plan.  Imogene Burn, PA-C Palliative Medicine  Please contact Palliative MedicineTeam phone at [phone] for questions and concerns between 7 am - 7 pm.   Please see AMION for individual provider pager numbers.

## 2016-09-11 NOTE — Progress Notes (Signed)
Follow up on current hospice patient.  Per Jeannie Fend, patient is on comfort measures and needs to be placed on Hospice Home waiting list.  Team at hospice notified.  Patient is awake and talking today.  Sitter at bedside- no family present.  Will continue to follow through final disposition.

## 2016-09-11 NOTE — Plan of Care (Signed)
Problem: Education: Goal: Knowledge of the prescribed therapeutic regimen will improve Outcome: Progressing Waiting on hospice home bed to become available for transport  Problem: Physical Regulation: Goal: Complications related to the disease process, condition or treatment will be avoided or minimized Outcome: Progressing 1:1 sitter  At bedside pt calm. Would not let  Nurse   Restart an iv on her

## 2016-09-11 NOTE — Progress Notes (Signed)
[place] [facility] [place] NAME: Haley Arias    MR#:  [id number]  DATE OF BIRTH:  Jan 02, 1951  SUBJECTIVE: Patient is on comfort care measures. The she is alert oriented, denies any complaints, sitter at bedside. No shortness of breath or cough. But by mouth intake is poor.   CHIEF COMPLAINT:   Chief Complaint  Patient presents with  . Shortness of Breath   - cheerful and talkative, denies any complaints  REVIEW OF SYSTEMS:  Review of Systems  Unable to perform ROS: Psychiatric disorder    DRUG ALLERGIES:   Allergies  Allergen Reactions  . Cogentin [Benztropine] Anxiety    VITALS:  Blood pressure (!) 99/58, pulse (!) 115, temperature 97.9 F (36.6 C), temperature source Oral, resp. rate 16, height '5\' 2"'$  (1.575 m), weight 44.4 kg (97 lb 14.4 oz), SpO2 97 %.  PHYSICAL EXAMINATION:  Physical Exam  GENERAL:  66 y.o.-year-old patient lying in the bed with no acute distress.  EYES: Pupils equal, round, reactive to light and accommodation. No scleral icterus. Extraocular muscles intact.  HEENT: Head atraumatic, normocephalic. Oropharynx and nasopharynx clear.  NECK:  Supple, no jugular venous distention. No thyroid enlargement, no tenderness.  LUNGS: Normal breath sounds bilaterally, no wheezing, rales,rhonchi or crepitation. No use of accessory muscles of respiration.  CARDIOVASCULAR: S1, S2 normal. No murmurs, rubs, or gallops.  ABDOMEN: Soft, nontender, nondistended. Bowel sounds present. No organomegaly or mass.  EXTREMITIES: No pedal edema, cyanosis, or clubbing.  NEUROLOGIC: Cranial nerves II through XII are intact. Muscle strength 5/5 in all extremities. Sensation intact. Gait not checked.  PSYCHIATRIC: The patient is alert and not oriented, schizophrenia. Disorganized thoughts  SKIN: No obvious rash, lesion, or ulcer.    LABORATORY PANEL:   CBC  Recent Labs Lab 09/09/16 0516  WBC 19.9*  HGB 8.8*  HCT 26.9*  PLT 586*    ------------------------------------------------------------------------------------------------------------------  Chemistries   Recent Labs Lab 09/08/16 1146 09/09/16 0516  NA 128* 128*  K 3.8 3.4*  CL 95* 98*  CO2 21* 20*  GLUCOSE 178* 234*  BUN 21* 19  CREATININE 0.83 0.47  CALCIUM 9.3 8.3*  AST 22  --   ALT 25  --   ALKPHOS 220*  --   BILITOT 1.4*  --    ------------------------------------------------------------------------------------------------------------------  Cardiac Enzymes  Recent Labs Lab 09/08/16 1146  TROPONINI <0.03   ------------------------------------------------------------------------------------------------------------------  RADIOLOGY:  No results found.  EKG:   Orders placed or performed during the hospital encounter of 09/08/16  . ED EKG  . ED EKG  . EKG 12-Lead  . EKG 12-Lead    ASSESSMENT AND PLAN:   66 year old female with past medical history significant for schizophrenia, recent diagnosis of metastatic lung cancer last week when she was admitted to behavioral medicine unit, tobacco use disorder presents to Hospital from group home secondary to difficulty breathing. She was noted to be hypoxic initially.  #1 acute hypoxic respiratory failure-secondary to right lower lobe pneumonia and pleural effusion. Also has right-sided lung cancer. -Continue comfort care measures, prognosis is poor.  #2 chronic anemia-anemia of chronic disease. No acute indication for transfusion.  #3 hyponatremia-I Hypokalemia-  #4 schizophrenia-recent admission to behavioral medicine unit last week for 10 days. #5 lung cancer- diagnosed with right lower lobe mass on CT chest last admission last week. Also had weight loss. Seen by oncology and palliative care last admission. Patient not a good candidate for chemotherapy due to compliance issues and mental disorder.  And patient did not want to have any chemotherapy or further workup done for her  malignancy. -After conversations with the family, patient has decided not to pursue further treatment. She is a DO NOT RESUSCITATE.  continue comfort care measures,  Possible discharge to hospice home if arrangements are made.    All the records are reviewed and case discussed with Care Management/Social Workerr. Management plans discussed with the patient, family and they are in agreement.  CODE STATUS: DNR  TOTAL TIME TAKING CARE OF THIS PATIENT: 38 minutes.   POSSIBLE D/C IN 2 DAYS, DEPENDING ON CLINICAL CONDITION.   Epifanio Lesches M.D on 09/11/2016 at 8:18 AM  Between 7am to 6pm - Pager - [phone]  After 6pm go to [email or web address] - password [email or web address] [facility]  Office  [phone]  CC: Primary care physician; No PCP Per Patient

## 2016-09-11 NOTE — Progress Notes (Signed)
Patient is currently on the [place]/ [facility] waiting list. Clinical Social Worker ([place]) will continue to follow and assist as needed.   [employer], LCSW [phone]

## 2016-09-12 LAB — CBC
HEMATOCRIT: 27.8 % — AB (ref 35.0–47.0)
HEMOGLOBIN: 9.1 g/dL — AB (ref 12.0–16.0)
MCH: 25.1 pg — ABNORMAL LOW (ref 26.0–34.0)
MCHC: 32.8 g/dL (ref 32.0–36.0)
MCV: 76.4 fL — AB (ref 80.0–100.0)
Platelets: 598 10*3/uL — ABNORMAL HIGH (ref 150–440)
RBC: 3.64 MIL/uL — AB (ref 3.80–5.20)
RDW: 20.3 % — ABNORMAL HIGH (ref 11.5–14.5)
WBC: 18.9 10*3/uL — ABNORMAL HIGH (ref 3.6–11.0)

## 2016-09-12 LAB — CREATININE, SERUM
Creatinine, Ser: 0.54 mg/dL (ref 0.44–1.00)
GFR calc non Af Amer: >60 mL/min (ref >60)

## 2016-09-12 MED ORDER — ENOXAPARIN SODIUM 40 MG/0.4ML ~~LOC~~ SOLN: 40.0000 mg | SUBCUTANEOUS | Status: DC

## 2016-09-12 NOTE — Clinical Social Work Note (Signed)
CSW received update from [place] from [facility] that this patient is #1 on the waitlist. CSW is following for possible dc to Hospice Home this weekend.  Santiago Bumpers, MSW, Latanya Presser [phone]

## 2016-09-12 NOTE — Progress Notes (Signed)
[place] at [place] NAME: Haley Arias    MR#:  [id number]  DATE OF BIRTH:  05/13/1951  SUBJECTIVE: She denies any complaints, on comfort care.   CHIEF COMPLAINT:   Chief Complaint  Patient presents with  . Shortness of Breath   - cheerful and talkative, denies any complaints  REVIEW OF SYSTEMS:  Review of Systems  Unable to perform ROS: Psychiatric disorder    DRUG ALLERGIES:   Allergies  Allergen Reactions  . Cogentin [Benztropine] Anxiety    VITALS:  Blood pressure (!) 99/53, pulse (!) 107, temperature 98 F (36.7 C), temperature source Oral, resp. rate 19, height '5\' 2"'$  (1.575 m), weight 44.4 kg (97 lb 14.4 oz), SpO2 96 %.  PHYSICAL EXAMINATION:  Physical Exam  GENERAL:  66 y.o.-year-old patient lying in the bed with no acute distress.  EYES: Pupils equal, round, reactive to light and accommodation. No scleral icterus. Extraocular muscles intact.  HEENT: Head atraumatic, normocephalic. Oropharynx and nasopharynx clear.  NECK:  Supple, no jugular venous distention. No thyroid enlargement, no tenderness.  LUNGS: Normal breath sounds bilaterally, no wheezing, rales,rhonchi or crepitation. No use of accessory muscles of respiration.  CARDIOVASCULAR: S1, S2 normal. No murmurs, rubs, or gallops.  ABDOMEN: Soft, nontender, nondistended. Bowel sounds present. No organomegaly or mass.  EXTREMITIES: No pedal edema, cyanosis, or clubbing.  NEUROLOGIC: Cranial nerves II through XII are intact. Muscle strength 5/5 in all extremities. Sensation intact. Gait not checked.  PSYCHIATRIC: The patient is alert and not oriented, schizophrenia. Disorganized thoughts  SKIN: No obvious rash, lesion, or ulcer.    LABORATORY PANEL:   CBC  Recent Labs Lab 09/09/16 0516  WBC 19.9*  HGB 8.8*  HCT 26.9*  PLT 586*    ------------------------------------------------------------------------------------------------------------------  Chemistries   Recent Labs Lab 09/08/16 1146 09/09/16 0516  NA 128* 128*  K 3.8 3.4*  CL 95* 98*  CO2 21* 20*  GLUCOSE 178* 234*  BUN 21* 19  CREATININE 0.83 0.47  CALCIUM 9.3 8.3*  AST 22  --   ALT 25  --   ALKPHOS 220*  --   BILITOT 1.4*  --    ------------------------------------------------------------------------------------------------------------------  Cardiac Enzymes  Recent Labs Lab 09/08/16 1146  TROPONINI <0.03   ------------------------------------------------------------------------------------------------------------------  RADIOLOGY:  No results found.  EKG:   Orders placed or performed during the hospital encounter of 09/08/16  . ED EKG  . ED EKG  . EKG 12-Lead  . EKG 12-Lead    ASSESSMENT AND PLAN:   66 year old female with past medical history significant for schizophrenia, recent diagnosis of metastatic lung cancer last week when she was admitted to behavioral medicine unit, tobacco use disorder presents to Hospital from group home secondary to difficulty breathing. She was noted to be hypoxic initially.  #1 acute hypoxic respiratory failure-secondary to right lower lobe pneumonia and pleural effusion. Also has right-sided lung cancer. -Continue comfort care measures, prognosis is poor.  #2 chronic anemia-anemia of chronic disease. No acute indication for transfusion.  #3 hyponatremia-I Hypokalemia-  #4 schizophrenia-recent admission to behavioral medicine unit last week for 10 days. #5 lung cancer- diagnosed with right lower lobe mass on CT chest last admission last week. Also had weight loss. Seen by oncology and palliative care last admission. Patient not a good candidate for chemotherapy due to compliance issues and mental disorder. And patient did not want to have any chemotherapy or further workup done for her  malignancy. -After conversations with the family,  patient has decided not to pursue further treatment. She is a DO NOT RESUSCITATE.  continue comfort care measures,  Possible discharge to hospice home if arrangements are made. Will  ask psychiatric to see if  Sitter can be discontinued.   All the records are reviewed and case discussed with Care Management/Social Workerr. Management plans discussed with the patient, family and they are in agreement.  CODE STATUS: DNR  TOTAL TIME TAKING CARE OF THIS PATIENT: 38 minutes.   POSSIBLE D/C IN 2 DAYS, DEPENDING ON CLINICAL CONDITION.   Epifanio Lesches M.D on 09/12/2016 at 8:16 AM  Between 7am to 6pm - Pager - [phone]  After 6pm go to [email or web address] - password [email or web address] [facility]  Office  [phone]  CC: Primary care physician; No PCP Per Patient

## 2016-09-14 NOTE — Progress Notes (Signed)
Visit made. Patient seen lying in bed, alert and interactive, denied pain or discomfort. Dry cough noted. Water given. She has not required any PRN's for symptom management. Per chart note review and discussion with Palliative PA Imogene Burn, she does not appear to be appropriate for the hospice home. CSW Darden Dates made aware. Will continue to follow and update hospice team. Thank you. Flo Shanks RN, BSN, [facility] [place], hospital liaison [phone] c

## 2016-09-14 NOTE — Progress Notes (Signed)
[place] at [place] NAME: Haley Arias    MR#:  [id number]  DATE OF BIRTH:  [birth year]  SUBJECTIVE: She denies any complaints, on comfort care.   CHIEF COMPLAINT:   Chief Complaint  Patient presents with  . Shortness of Breath   - Sleeps a lot. Eating better  REVIEW OF SYSTEMS:  Review of Systems  Unable to perform ROS: Psychiatric disorder    DRUG ALLERGIES:   Allergies  Allergen Reactions  . Cogentin [Benztropine] Anxiety    VITALS:  Blood pressure (!) 97/58, pulse (!) 102, temperature 98.5 F (36.9 C), resp. rate 20, height '5\' 2"'$  (1.575 m), weight 44.4 kg (97 lb 14.4 oz), SpO2 95 %.  PHYSICAL EXAMINATION:  Physical Exam  GENERAL:  66 y.o.-year-old patient lying in the bed with no acute distress.  EYES:  No scleral icterus. Extraocular muscles intact.  HEENT: Head atraumatic, normocephalic. LUNGS: Normal breath sounds bilaterally CARDIOVASCULAR: S1, S2 normal. No murmurs, rubs, or gallops.  EXTREMITIES: No pedal edema, cyanosis, or clubbing.  PSYCHIATRIC: The patient is alert and awake. Disorganized thoughts   LABORATORY PANEL:   CBC  Recent Labs Lab 09/12/16 0758  WBC 18.9*  HGB 9.1*  HCT 27.8*  PLT 598*   ------------------------------------------------------------------------------------------------------------------  Chemistries   Recent Labs Lab 09/08/16 1146 09/09/16 0516 09/12/16 0758  NA 128* 128*  --   K 3.8 3.4*  --   CL 95* 98*  --   CO2 21* 20*  --   GLUCOSE 178* 234*  --   BUN 21* 19  --   CREATININE 0.83 0.47 0.54  CALCIUM 9.3 8.3*  --   AST 22  --   --   ALT 25  --   --   ALKPHOS 220*  --   --   BILITOT 1.4*  --   --    ------------------------------------------------------------------------------------------------------------------  Cardiac Enzymes  Recent Labs Lab 09/08/16 1146  TROPONINI <0.03    ------------------------------------------------------------------------------------------------------------------  RADIOLOGY:  No results found.  EKG:   Orders placed or performed during the hospital encounter of 09/08/16  . ED EKG  . ED EKG  . EKG 12-Lead  . EKG 12-Lead    ASSESSMENT AND PLAN:   66 year old female with past medical history significant for schizophrenia, recent diagnosis of metastatic lung cancer last week when she was admitted to behavioral medicine unit, tobacco use disorder presents to Hospital from group home secondary to difficulty breathing. She was noted to be hypoxic initially.  #1 acute hypoxic respiratory failure-secondary to right lower lobe pneumonia and pleural effusion. Also has right-sided lung cancer. #2 chronic anemia-anemia of chronic disease. #3 hyponatremia Hypokalemia #4 schizophrenia #5 lung cancer- diagnosed with right lower lobe mass on CT chest last admission last week.  Seen by oncology and palliative care last admission. Patient not a good candidate for chemotherapy due to compliance issues and mental disorder. And patient did not want to have any chemotherapy or further workup done for her malignancy. -After conversations with the family, patient has decided not to pursue further treatment. She is a DO NOT RESUSCITATE.   continue comfort care measures   Possible discharge to hospice home if bed available   All the records are reviewed and case discussed with Care Management/Social Worker Management plans discussed with the patient, family and they are in agreement.  CODE STATUS: DNR  TOTAL TIME TAKING CARE OF THIS PATIENT: 20 minutes.   Hillary Bow R M.D on 09/14/2016  at 1:43 PM  Between 7am to 6pm - Pager - [phone]  After 6pm go to [email or web address] - password [email or web address] [facility]  Office  [phone]  CC: Primary care physician; No PCP Per Patient

## 2016-09-14 NOTE — Progress Notes (Signed)
While rounding, [place] made initial visit to room 102. Pt was just waking from a nap. Pt was pleasant and appreciated the visit. Pt stated that she was feeling "pretty good" but wanted to get back to her nap. CH offered a short prayer for rest. CH is available for follow up as needed.    09/14/16 1200  Clinical Encounter Type  Visited With Patient;Health care provider  Visit Type Initial;Spiritual support  Consult/Referral To Chaplain  Spiritual Encounters  Spiritual Needs Prayer

## 2016-09-14 NOTE — Progress Notes (Signed)
Patient declined any cough medicine

## 2016-09-15 MED ORDER — FLUPHENAZINE DECANOATE 25 MG/ML IJ SOLN
25.0000 mg | Freq: Once | INTRAMUSCULAR | Status: AC
Start: 2016-09-16 — End: 2016-09-16
  Administered 2016-09-16: 09:00:00 25 mg via INTRAMUSCULAR
  Filled 2016-09-15: qty 1

## 2016-09-15 NOTE — Progress Notes (Signed)
   [place] at [place] NAME: Haley Arias    MR#:  [id number]  DATE OF BIRTH:  June 05, 1951    CHIEF COMPLAINT:   Chief Complaint  Patient presents with  . Shortness of Breath   Sleeps a lot. Wakes on calling her name Eating well Worked with PT  REVIEW OF SYSTEMS:  Review of Systems  Unable to perform ROS: Psychiatric disorder    DRUG ALLERGIES:   Allergies  Allergen Reactions  . Cogentin [Benztropine] Anxiety    VITALS:  Blood pressure 97/72, pulse (!) 101, temperature 98.3 F (36.8 C), resp. rate 20, height '5\' 2"'$  (1.575 m), weight 44.4 kg (97 lb 14.4 oz), SpO2 100 %.  PHYSICAL EXAMINATION:  Physical Exam  GENERAL:  66 y.o.-year-old patient lying in the bed with no acute distress.  EYES:  No scleral icterus. Extraocular muscles intact.  HEENT: Head atraumatic, normocephalic. LUNGS: Normal breath sounds bilaterally CARDIOVASCULAR: S1, S2 normal. No murmurs, rubs, or gallops.  EXTREMITIES: No pedal edema, cyanosis, or clubbing.  PSYCHIATRIC: The patient is alert and awake. Disorganized thoughts   LABORATORY PANEL:   CBC  Recent Labs Lab 09/12/16 0758  WBC 18.9*  HGB 9.1*  HCT 27.8*  PLT 598*   ------------------------------------------------------------------------------------------------------------------  Chemistries   Recent Labs Lab 09/09/16 0516 09/12/16 0758  NA 128*  --   K 3.4*  --   CL 98*  --   CO2 20*  --   GLUCOSE 234*  --   BUN 19  --   CREATININE 0.47 0.54  CALCIUM 8.3*  --    ------------------------------------------------------------------------------------------------------------------  Cardiac Enzymes No results for input(s): TROPONINI in the last 168 hours. ------------------------------------------------------------------------------------------------------------------  RADIOLOGY:  No results found.  EKG:   Orders placed or performed during the hospital encounter of  09/08/16  . ED EKG  . ED EKG  . EKG 12-Lead  . EKG 12-Lead    ASSESSMENT AND PLAN:   66 year old female with past medical history significant for schizophrenia, recent diagnosis of metastatic lung cancer last week when she was admitted to behavioral medicine unit, tobacco use disorder presents to Hospital from group home secondary to difficulty breathing. She was noted to be hypoxic initially.  #1 acute hypoxic respiratory failure-secondary to right lower lobe pneumonia and pleural effusion. Also has right-sided lung cancer. #2 chronic anemia-anemia of chronic disease. #3 hyponatremia Hypokalemia #4 schizophrenia #5 lung cancer- diagnosed with right lower lobe mass on CT chest last admission last week.  Seen by oncology and palliative care last admission. Patient not a good candidate for chemotherapy due to compliance issues and mental disorder. And patient did not want to have any chemotherapy or further workup done for her malignancy. -After conversations with the family, patient has decided not to pursue further treatment. She is a DO NOT RESUSCITATE.  PT eval. SNF at discharge with hospice following   All the records are reviewed and case discussed with Care Management/Social Worker Management plans discussed with the patient, family and they are in agreement.  CODE STATUS: DNR  TOTAL TIME TAKING CARE OF THIS PATIENT: 20 minutes.   Hillary Bow R M.D on 09/15/2016 at 11:54 AM  Between 7am to 6pm - Pager - [phone]  After 6pm go to [email or web address] - password [email or web address] [facility]  Office  [phone]  CC: Primary care physician; No PCP Per Patient

## 2016-09-15 NOTE — Progress Notes (Signed)
Visit made. Patient seen lying in bed, resting, easily roused. Interactive with Probation officer. She reports "I know why I am sick,it is my thyroid, that is why I have no balance, they will give me my medicine and I will be better and walk ok again". Patient walked with PT this morning and is requiring more assistance. Dry cough noted, through out visit, staff RN Estill Bamberg notified and PRN medication given as patient was agreeable. Patient's brother visited today, patient feels that he will "take her in if my thyroid is fixed". She continues to have very poor insight to her condition. Discharge plan is in progress. Will continue to follow and update hospice team. Thank you. Flo Shanks RN, BSN, [facility] [place], hospital Liaison [phone] c

## 2016-09-15 NOTE — Progress Notes (Signed)
I visited [name] at bedside.  She is alert, smiling and talkative.  She does not have respiratory distress and is not on oxygen.  She does have a non-productive cough.  She complains that Zoloft makes her feel like she's being eaten up inside and asks if she can decline taking it.  I asked how she was emotionally - she replied, "Just fine".  I recommended she continue Zoloft.  States she was out of bed in the wheel chair yesterday.  Assessment:  66 yo female with advanced metastatic lung cancer and schizophrenia.  Weak, but A&O, and taking POs.  No further treatment for lung cancer.  Prognosis:  Less than 6 months.  Patient is at high risk for an acute event at any time due to very advanced disease.  Palliative Medicine Team will sign off for now, but continue to shadow the chart for decline.  Total time:  15 min.   Haley Arias, [place] [facility] Pager: [phone]

## 2016-09-15 NOTE — Evaluation (Signed)
Physical Therapy Evaluation Patient Details Name: Haley Arias MRN: [id number] DOB: Jul 11, 1950 Today's Date: 09/15/2016   History of Present Illness  66 y.o. female with a known history of Schizophrenia metastatic lung cancer, emphysema, tobacco abuse presented to the emergency room with difficulty breathing. Patient has refused therapy for metastatic lung cancer and currently under hospice.  Clinical Impression  Pt is able to do some limited ambulation with a walker but is unsteady and inconsistent how she tolerates and responds to cuing.  She is generally weak and displays poor awareness, but overall showed good effort.  Plan is apparently home with hospice and this seems reasonable, would benefit from HHPT as she is not at her baseline or safe.     Follow Up Recommendations Home health PT (plan is home with hospice)    Equipment Recommendations       Recommendations for Other Services Rehab consult     Precautions / Restrictions Precautions Precautions: Fall Restrictions Weight Bearing Restrictions: No      Mobility  Bed Mobility Overal bed mobility: Modified Independent             General bed mobility comments: Pt able to get herself up to EOB without assist  Transfers Overall transfer level: Modified independent Equipment used: Rolling walker (2 wheeled)             General transfer comment: Pt stood up 3 seperate times, on the first 2 attempts she stood briefly, described feeling dizzy and quickly sat back down on the bed.   Ambulation/Gait Ambulation/Gait assistance: Min assist Ambulation Distance (Feet): 45 Feet Assistive device: Rolling walker (2 wheeled)       General Gait Details: Pt did not use walker appropriately and needed constant cuing to keep on task and in the walker aappropriately.   Stairs            Wheelchair Mobility    Modified Rankin (Stroke Patients Only)       Balance Overall balance assessment: Needs assistance  (Pt needed UE use during all standing for safety/awareness)                                           Pertinent Vitals/Pain Pain Assessment: No/denies pain    Home Living Family/patient expects to be discharged to:: Skilled nursing facility Living Arrangements: Group Home                    Prior Function           Comments: Pt unable to report, appears she was able to ambulate, etc as much as she needed.     Hand Dominance        Extremity/Trunk Assessment   Upper Extremity Assessment Upper Extremity Assessment: Generalized weakness    Lower Extremity Assessment Lower Extremity Assessment: Generalized weakness       Communication   Communication: No difficulties  Cognition   Behavior During Therapy: Impulsive Overall Cognitive Status: History of cognitive impairments - at baseline                                        General Comments      Exercises     Assessment/Plan    PT Assessment Patient needs continued PT services  PT Problem  List Decreased strength;Decreased range of motion;Decreased activity tolerance;Decreased balance;Decreased mobility;Decreased coordination;Decreased knowledge of use of DME;Decreased safety awareness;Decreased cognition       PT Treatment Interventions DME instruction;Gait training;Stair training;Functional mobility training;Therapeutic activities;Therapeutic exercise;Balance training;Patient/family education    PT Goals (Current goals can be found in the Care Plan section)  Acute Rehab PT Goals Patient Stated Goal: none stated PT Goal Formulation: With patient Time For Goal Achievement: 09/29/16 Potential to Achieve Goals: Fair    Frequency Min 2X/week   Barriers to discharge        Co-evaluation               End of Session Equipment Utilized During Treatment: Gait belt   Patient left: with call bell/phone within reach;with bed alarm set (tele-sitter) Nurse  Communication: Mobility status PT Visit Diagnosis: Muscle weakness (generalized) (M62.81);Difficulty in walking, not elsewhere classified (R26.2)    Time: 8676-7209 PT Time Calculation (min) (ACUTE ONLY): 17 min   Charges:   PT Evaluation $PT Eval Low Complexity: 1 Procedure     PT G Codes:          Kreg Shropshire, DPT 09/15/2016, 3:17 PM

## 2016-09-15 NOTE — Clinical Social Work Note (Signed)
CSW met with pt's brother regarding disposition. PT eval is pending, and this will determine the appropriate level of care. Pt is no longer residential hospice appropriate as pt has improved. Pt's options are for SNF or ALF with Hospice (or Palliative Care) following. Pt's PASARR needs to be addressed. Pt's brother would prefer a locked unit for placement. Pt's brother is in agreement with discharge planning and would like an update. CSW also has a phone call into [place] to speak with pt's payee. CSW will continue to follow.   Darden Dates, MSW, LCSW  Clinical Social Worker  [phone]

## 2016-09-16 LAB — CBC
HEMATOCRIT: 28 % — AB (ref 35.0–47.0)
HEMOGLOBIN: 9.3 g/dL — AB (ref 12.0–16.0)
MCH: 25.5 pg — AB (ref 26.0–34.0)
MCHC: 33.3 g/dL (ref 32.0–36.0)
MCV: 76.4 fL — AB (ref 80.0–100.0)
Platelets: 540 10*3/uL — ABNORMAL HIGH (ref 150–440)
RBC: 3.67 MIL/uL — AB (ref 3.80–5.20)
RDW: 19.9 % — ABNORMAL HIGH (ref 11.5–14.5)
WBC: 16.9 10*3/uL — AB (ref 3.6–11.0)

## 2016-09-16 LAB — BASIC METABOLIC PANEL
ANION GAP: 12 (ref 5–15)
BUN: 31 mg/dL — AB (ref 6–20)
CHLORIDE: 96 mmol/L — AB (ref 101–111)
CO2: 25 mmol/L (ref 22–32)
Calcium: 9.7 mg/dL (ref 8.9–10.3)
Creatinine, Ser: 0.61 mg/dL (ref 0.44–1.00)
GFR calc Af Amer: >60 mL/min (ref >60)
GFR calc non Af Amer: >60 mL/min (ref >60)
GLUCOSE: 100 mg/dL — AB (ref 65–99)
POTASSIUM: 3.9 mmol/L (ref 3.5–5.1)
Sodium: 133 mmol/L — ABNORMAL LOW (ref 135–145)

## 2016-09-16 LAB — TSH: TSH: 24.175 u[IU]/mL — ABNORMAL HIGH (ref 0.350–4.500)

## 2016-09-16 LAB — T4, FREE: Free T4: 0.62 ng/dL (ref 0.61–1.12)

## 2016-09-16 NOTE — Clinical Social Work Note (Signed)
CSW met with pt to address discharge plan. Pt expressed an interest in the [facility]. CSW will follow up with facility. CSW spoke with pt's brother to address disposition. Pt's brother is in agreement with placement, however if pt's husband and pt decide otherwise, pt's brother would be supportive. [facility] is able to make a bed offer with Hospice services following. CSW will continue to follow.   Darden Dates, MSW, LCSW  Clinical Social Worker  [phone]

## 2016-09-16 NOTE — Plan of Care (Signed)
Problem: Physical Regulation: Goal: Complications related to the disease process, condition or treatment will be avoided or minimized Outcome: Not Progressing Patient continues to eat minimally even with encouragement

## 2016-09-16 NOTE — Progress Notes (Signed)
[place] at [facility] NAME: Haley Arias    MR#:  [id number]  DATE OF BIRTH:  1950/10/02  SUBJECTIVE:  CHIEF COMPLAINT:   Chief Complaint  Patient presents with  . Shortness of Breath   - poor oral intake - requesting her synthroid medicine - awaiting PASSR for placement  REVIEW OF SYSTEMS:  Review of Systems  Unable to perform ROS: Psychiatric disorder    DRUG ALLERGIES:   Allergies  Allergen Reactions  . Cogentin [Benztropine] Anxiety    VITALS:  Blood pressure 95/61, pulse (!) 102, temperature 98.3 F (36.8 C), resp. rate 18, height '5\' 2"'$  (1.575 m), weight 44.4 kg (97 lb 14.4 oz), SpO2 95 %.  PHYSICAL EXAMINATION:  Physical Exam  GENERAL:  66 y.o.-year-old ill nourished patient sitting in the bed with no acute distress.  EYES: Pupils equal, round, reactive to light and accommodation. No scleral icterus. Extraocular muscles intact.  HEENT: Head atraumatic, normocephalic. Oropharynx and nasopharynx clear.  NECK:  Supple, no jugular venous distention. No thyroid enlargement, no tenderness.  LUNGS: Normal breath sounds bilaterally, no wheezing, rales,rhonchi or crepitation. No use of accessory muscles of respiration.  CARDIOVASCULAR: S1, S2 normal. No murmurs, rubs, or gallops.  ABDOMEN: Soft, nontender, nondistended. Bowel sounds present. No organomegaly or mass.  EXTREMITIES: No pedal edema, cyanosis, or clubbing.  NEUROLOGIC: Cranial nerves II through XII are intact. Muscle strength 5/5 in all extremities. Sensation intact. Gait not checked.  PSYCHIATRIC: The patient is alert and oriented to self, disorganised thoughts  SKIN: No obvious rash, lesion, or ulcer.    LABORATORY PANEL:   CBC  Recent Labs Lab 09/12/16 0758  WBC 18.9*  HGB 9.1*  HCT 27.8*  PLT 598*   ------------------------------------------------------------------------------------------------------------------  Chemistries   Recent Labs Lab  09/12/16 0758  CREATININE 0.54   ------------------------------------------------------------------------------------------------------------------  Cardiac Enzymes No results for input(s): TROPONINI in the last 168 hours. ------------------------------------------------------------------------------------------------------------------  RADIOLOGY:  No results found.  EKG:   Orders placed or performed during the hospital encounter of 09/08/16  . ED EKG  . ED EKG  . EKG 12-Lead  . EKG 12-Lead    ASSESSMENT AND PLAN:   66 year old female with past medical history significant for schizophrenia, recent diagnosis of metastatic lung cancer last week when she was admitted to behavioral medicine unit, tobacco use disorder presents to Hospital from group home secondary to difficulty breathing. She was noted to be hypoxic initially.  #1 acute hypoxic respiratory failure-secondary to right lower lobe pneumonia and pleural effusion. Also has right-sided lung cancer. -Currently on room air. Finished her antibiotics.  #2 chronic anemia-anemia of chronic disease. Monitor by repeating labs today.  #3 hypothyroidism-recheck TSH and T4 and asked Synthroid if needed  #4 schizophrenia-appreciate psych consult. Continue her Zyprexa, Zoloft, trazodone, Prolixin and amantadine  #5 lung cancer- diagnosed with right lower lobe mass on CT chest last admission last week.  Seen by oncology and palliative care. Patient not a good candidate for chemotherapy due to compliance issues and mental disorder. And patient did not want to have any chemotherapy or further workup done for her malignancy. -After conversations with the family, patient has decided not to pursue further treatment. She is a DO NOT RESUSCITATE  Physical therapy recommended home with supervision and home health. Patient not able to care for herself at this time. Social worker working on placement.   All the records are reviewed and case  discussed with Care Management/Social Workerr. Management plans discussed  with the patient, family and they are in agreement.  CODE STATUS: DNR  TOTAL TIME TAKING CARE OF THIS PATIENT: 37 minutes.   POSSIBLE D/C IN 2 DAYS, DEPENDING ON CLINICAL CONDITION.   Gladstone Lighter M.D on 09/16/2016 at 9:34 AM  Between 7am to 6pm - Pager - [phone]  After 6pm go to [email or web address] - password [email or web address] [place] [facility]  Office  [phone]  CC: Primary care physician; No PCP Per Patient

## 2016-09-16 NOTE — Progress Notes (Signed)
Visit made. Patient seen lying in bed, easily awakened by voice. Lunch tray at bedside, patient reports she could not find her silverware and had eaten a few bites with her fingers. She continues with poor apetite, eating bites and drinking ensure and Coke. She remains continent and does ambulate with assistance to the bathroom and remains on room air. No cough noted today, she did receive a PRN does of guaifenesin cough syrup overnight. Scheduled  Prolixin injection given this morning. She reported that she is thinking about going to [facility] re[facility] to "gain weight and work on my balance", she continues to be concerned about her thyroid and wants to make sure it is "fixed with medication". Per chart note review a TSH level was ordered, results show an elevated TSH at 24.175. Per conversation with staff RN Butch Penny attending MD will order thyroid medication. Will continue to follow and update Hospice team. Thank you. Flo Shanks RN, BSN, [facility] [place], hospital Liaison [phone] c

## 2016-09-17 MED ORDER — ENSURE ENLIVE PO LIQD
237.0000 mL | Freq: Three times a day (TID) | ORAL | Status: DC
  Administered 2016-09-17 – 2016-09-22 (×15): 237 mL via ORAL

## 2016-09-17 MED ORDER — LEVOTHYROXINE SODIUM 50 MCG PO TABS
50.0000 ug | ORAL_TABLET | Freq: Every day | ORAL | Status: DC
  Administered 2016-09-17 – 2016-09-22 (×6): 50 ug via ORAL
  Filled 2016-09-17 (×6): qty 1

## 2016-09-17 MED ORDER — MEGESTROL ACETATE 40 MG PO TABS
40.0000 mg | ORAL_TABLET | Freq: Every day | ORAL | Status: DC
  Administered 2016-09-17 – 2016-09-21 (×5): 40 mg via ORAL
  Filled 2016-09-17 (×5): qty 1

## 2016-09-17 NOTE — Plan of Care (Signed)
Problem: Education: Goal: Knowledge of the prescribed therapeutic regimen will improve Outcome: Not Progressing Patient does not remember education.  Problem: Physical Regulation: Goal: Quality of life will improve Outcome: Not Progressing Patient continues to not eat only drinking ensures and Cokes

## 2016-09-17 NOTE — Progress Notes (Signed)
Nutrition Follow-up  DOCUMENTATION CODES:   Severe malnutrition in context of chronic illness, Underweight  INTERVENTION:  - Ensure Enlive TID each supplement provides 350 calories 20 grams protein - Provide pt with finger food diet - Recommend feeding assistance to promote po intake  NUTRITION DIAGNOSIS:   Malnutrition (Severe) related to chronic illness (Metastatic lung cancer) as evidenced by severe depletion of body fat, severe depletion of muscle mass.  GOAL:   Other (Comment) (Patient will consume as many calories as possible)  MONITOR:   PO intake, Supplement acceptance, Labs, Weight trends, I & O's  REASON FOR ASSESSMENT:   Malnutrition Screening Tool    ASSESSMENT:   66 y.o. female with a known history of Schizophrenia metastatic lung cancer, emphysema, tobacco abuse presented to the emergency room with difficulty breathing. Patient has refused therapy for metastatic lung cancer and currently under hospice.  Discussed pt with RNs, pt has only been consuming bites and sips since admission (day 9 of LOS). Pt is taking some Ensure. Discussed finger food diet may promote po intake.   Pt has no recent weight history documented in chart.  Pt reports eating okay and enjoying Ensures.  Pt's last bowel movement was 09/14/16 but per RN report pt's stomach not distended and does not seem constipated.  Labs reviewed; Na (133) Medications reviewed; Megace  Nutrition-focused physical exam completed. Findings include severe fat depletion, severe muscle depletion and no edema  Diet Order:  Diet regular Room service appropriate? Yes; Fluid consistency: Thin  Skin:  Reviewed, no issues  Last BM:  3/26  Height:   Ht Readings from Last 1 Encounters:  09/09/16 '5\' 2"'$  (1.575 m)    Weight:   Wt Readings from Last 1 Encounters:  09/09/16 97 lb 14.4 oz (44.4 kg)    Ideal Body Weight:  50 kg (Per stated height)  BMI:  Body mass index is 17.91 kg/m.  Estimated  Nutritional Needs:   Kcal:  9470-7615  Protein:  50-60  Fluid:  >/= 1.5 L/d  EDUCATION NEEDS:   Education needs no appropriate at this time  [employer] Intern

## 2016-09-17 NOTE — Progress Notes (Signed)
Visit made, patient seen siting up in the bed, awake, just had her morning medications. She again did not eat any breakfast, continues with Ensure and some Coke. Synthroid has been started and patient continues to think it will help her "feel better". She has reported to staff that her husband will becoming tomorrow, it is unclear if she has really made contact with him. Per chart review, no PRN's have been needed for dyspnea or pain,. Writer spoke with CSW Darden Dates who is attempting to place patient at [facility], this would be long term care NOT rehab. She would need to be followed by another hospice if that plan were to work out.  Please contact Burt at [phone] if patient discharges Friday, Saturday or Sunday. Ginnie Smart Nulty made aware. Will continue to follow and update hospice team. Notes faxed to triage. Thank you. Flo Shanks RN, BSN, [facility] [place], hospital Liaison [phone] c

## 2016-09-17 NOTE — Progress Notes (Signed)
[place] at [place] NAME: Alfredo Collymore    MR#:  [id number]  DATE OF BIRTH:  06/08/1951  SUBJECTIVE:  CHIEF COMPLAINT:   Chief Complaint  Patient presents with  . Shortness of Breath   - continues to have poor oral intake - awaiting placement, prefers to sleep most of the day  REVIEW OF SYSTEMS:  Review of Systems  Unable to perform ROS: Psychiatric disorder    DRUG ALLERGIES:   Allergies  Allergen Reactions  . Cogentin [Benztropine] Anxiety    VITALS:  Blood pressure 98/68, pulse (!) 109, temperature 97.9 F (36.6 C), temperature source Oral, resp. rate 16, height '5\' 2"'$  (1.575 m), weight 44.4 kg (97 lb 14.4 oz), SpO2 95 %.  PHYSICAL EXAMINATION:  Physical Exam  GENERAL:  66 y.o.-year-old ill nourished patient sitting in the bed with no acute distress.  EYES: Pupils equal, round, reactive to light and accommodation. No scleral icterus. Extraocular muscles intact.  HEENT: Head atraumatic, normocephalic. Oropharynx and nasopharynx clear.  NECK:  Supple, no jugular venous distention. No thyroid enlargement, no tenderness.  LUNGS: Normal breath sounds bilaterally, no wheezing, rales,rhonchi or crepitation. No use of accessory muscles of respiration.  CARDIOVASCULAR: S1, S2 normal. No murmurs, rubs, or gallops.  ABDOMEN: Soft, nontender, nondistended. Bowel sounds present. No organomegaly or mass.  EXTREMITIES: No pedal edema, cyanosis, or clubbing.  NEUROLOGIC: Cranial nerves II through XII are intact. Muscle strength 5/5 in all extremities. Sensation intact. Gait not checked.  PSYCHIATRIC: The patient is alert and oriented to self, disorganised thoughts  SKIN: No obvious rash, lesion, or ulcer.    LABORATORY PANEL:   CBC  Recent Labs Lab 09/16/16 1116  WBC 16.9*  HGB 9.3*  HCT 28.0*  PLT 540*    ------------------------------------------------------------------------------------------------------------------  Chemistries   Recent Labs Lab 09/16/16 1116  NA 133*  K 3.9  CL 96*  CO2 25  GLUCOSE 100*  BUN 31*  CREATININE 0.61  CALCIUM 9.7   ------------------------------------------------------------------------------------------------------------------  Cardiac Enzymes No results for input(s): TROPONINI in the last 168 hours. ------------------------------------------------------------------------------------------------------------------  RADIOLOGY:  No results found.  EKG:   Orders placed or performed during the hospital encounter of 09/08/16  . ED EKG  . ED EKG  . EKG 12-Lead  . EKG 12-Lead    ASSESSMENT AND PLAN:   66 year old female with past medical history significant for schizophrenia, recent diagnosis of metastatic lung cancer last week when she was admitted to behavioral medicine unit, tobacco use disorder presents to Hospital from group home secondary to difficulty breathing. She was noted to be hypoxic initially.  #1 acute hypoxic respiratory failure-secondary to right lower lobe pneumonia and pleural effusion. Also has right-sided lung cancer. -Currently on room air. Finished her antibiotics.  #2 chronic anemia-anemia of chronic disease. Stable for now  #3 hypothyroidism-elevated TSH and borderline low T4, started synthroid  #4 schizophrenia-appreciate psych consult. Continue her Zyprexa, Zoloft, trazodone, Prolixin and amantadine  #5 lung cancer- diagnosed with right lower lobe mass on CT chest last admission last week.  Seen by oncology and palliative care. Patient not a good candidate for chemotherapy due to compliance issues and mental disorder. And patient did not want to have any chemotherapy or further workup done for her malignancy. -After conversations with the family, patient has decided not to pursue further treatment. She is a DO  NOT RESUSCITATE  #6 Failure to thrive adult- started megace, synthroid added Monitor Could be from her underlying malignancy  Social worker working on placement. Hospice following. If worsens at any time- consider hospice home   All the records are reviewed and case discussed with Care Management/Social Workerr. Management plans discussed with the patient, family and they are in agreement.  CODE STATUS: DNR  TOTAL TIME TAKING CARE OF THIS PATIENT: 33 minutes.   POSSIBLE D/C IN 2 DAYS, DEPENDING ON CLINICAL CONDITION.   Gladstone Lighter M.D on 09/17/2016 at 12:59 PM  Between 7am to 6pm - Pager - [phone]  After 6pm go to [email or web address] - password [email or web address] [place] [facility]  Office  [phone]  CC: Primary care physician; No PCP Per Patient

## 2016-09-17 NOTE — Progress Notes (Signed)
PT Cancellation Note  Patient Details Name: Haley Arias MRN: [id number] DOB: 06-17-1951   Cancelled Treatment:    Reason Eval/Treat Not Completed: Fatigue/lethargy limiting ability to participate   Pt offered and encouraged to participate this am.  Pt declined stating she had just received her medications and wanted to go to sleep.  Encouraged ambulation and stated I would help her back to bed after gait but she continued to refuse. Nurse stated she was out of bed for about 15 minutes in chair this morning.   Chesley Noon, PTA 09/17/16, 10:27 AM

## 2016-09-18 DIAGNOSIS — E43 Unspecified severe protein-calorie malnutrition: Secondary | ICD-10-CM | POA: Insufficient documentation

## 2016-09-18 NOTE — NC FL2 (Signed)
[place] LEVEL OF CARE SCREENING TOOL     IDENTIFICATION  Patient Name: Haley Arias Birthdate: 27-Feb-1951 Sex: female Admission Date (Current Location): 09/08/2016  [place] and [place] Number:  Engineering geologist and Address:  [facility], [address], [place], [place] [address]      Provider Number: [id number]  Attending Physician Name and Address:  Gladstone Lighter, MD  Relative Name and Phone Number:       Current Level of Care: Hospital Recommended Level of Care: [place] Prior Approval Number:    Date Approved/Denied:   PASRR Number:    Discharge Plan: SNF    Current Diagnoses: Patient Active Problem List   Diagnosis Date Noted  . Protein-calorie malnutrition, severe 09/18/2016  . Palliative care encounter   . Dyspnea   . Sepsis ([place]) 09/08/2016  . CAP (community acquired pneumonia) 09/08/2016  . Failure to thrive in adult   . Palliative care by specialist   . DNR (do not resuscitate)   . Primary malignant neoplasm of right lung metastatic to other site [facility])   . Metastasis ([place])   . Schizophrenia ([place]) 08/28/2016  . COPD (chronic obstructive pulmonary disease) ([place]) 08/28/2016  . Tobacco use disorder 08/28/2016    Orientation RESPIRATION BLADDER Height & Weight     Self, Time, Situation, Place  Normal Continent Weight: 97 lb 14.4 oz (44.4 kg) Height:  '5\' 2"'$  (157.5 cm) (stated by pt.)  BEHAVIORAL SYMPTOMS/MOOD NEUROLOGICAL BOWEL NUTRITION STATUS      Continent Diet (Finger Foods, Thin Liquids)  AMBULATORY STATUS COMMUNICATION OF NEEDS Skin   Limited Assist Verbally Normal                       Personal Care Assistance Level of Assistance  Bathing, Feeding, Dressing Bathing Assistance: Limited assistance Feeding assistance: Limited assistance Dressing Assistance: Limited assistance     Functional Limitations Info  Sight, Hearing, Speech Sight Info: Adequate Hearing Info:  Adequate Speech Info: Adequate    SPECIAL CARE FACTORS FREQUENCY                       Contractures Contractures Info: Not present    Additional Factors Info  Code Status, Allergies, Psychotropic Code Status Info: DNR Allergies Info: Cogentin (Benztropine) Psychotropic Info: Medications:  Zyprexa, Zoloft         Current Medications (09/18/2016):  This is the current hospital active medication list Current Facility-Administered Medications  Medication Dose Route Frequency Provider Last Rate Last Dose  . acetaminophen (TYLENOL) tablet 650 mg  650 mg Oral Q6H PRN Saundra Shelling, MD       Or  . acetaminophen (TYLENOL) suppository 650 mg  650 mg Rectal Q6H PRN Saundra Shelling, MD      . amantadine (SYMMETREL) capsule 100 mg  100 mg Oral QHS Saundra Shelling, MD   100 mg at 09/17/16 2018  . feeding supplement (ENSURE ENLIVE) (ENSURE ENLIVE) liquid 237 mL  237 mL Oral TID BM Gladstone Lighter, MD   237 mL at 09/18/16 0845  . fluPHENAZine (PROLIXIN) tablet 5 mg  5 mg Oral QHS Saundra Shelling, MD   5 mg at 09/17/16 2018  . guaiFENesin-dextromethorphan (ROBITUSSIN DM) 100-10 MG/5ML syrup 5 mL  5 mL Oral Q4H PRN Epifanio Lesches, MD   5 mL at 09/17/16 0351  . haloperidol lactate (HALDOL) injection 2 mg  2 mg Intravenous Q6H PRN Knox Royalty, NP      .  levothyroxine (SYNTHROID, LEVOTHROID) tablet 50 mcg  50 mcg Oral QAC breakfast Gladstone Lighter, MD   50 mcg at 09/18/16 0845  . LORazepam (ATIVAN) tablet 1 mg  1 mg Oral Q6H PRN Knox Royalty, NP      . megestrol (MEGACE) tablet 40 mg  40 mg Oral Daily Gladstone Lighter, MD   40 mg at 09/18/16 0845  . morphine 2 MG/ML injection 1 mg  1 mg Intravenous Q1H PRN Knox Royalty, NP      . OLANZapine (ZYPREXA) tablet 7.5 mg  7.5 mg Oral Daily Saundra Shelling, MD   7.5 mg at 09/18/16 0845  . ondansetron (ZOFRAN) tablet 4 mg  4 mg Oral Q6H PRN Saundra Shelling, MD   4 mg at 09/17/16 0240   Or  . ondansetron (ZOFRAN) injection 4 mg  4 mg Intravenous Q6H  PRN Pavan Pyreddy, MD      . senna-docusate (Senokot-S) tablet 1 tablet  1 tablet Oral QHS PRN Saundra Shelling, MD      . sertraline (ZOLOFT) tablet 100 mg  100 mg Oral Daily Saundra Shelling, MD   100 mg at 09/18/16 0845  . traZODone (DESYREL) tablet 50 mg  50 mg Oral QHS Knox Royalty, NP   50 mg at 09/17/16 2018     Discharge Medications: Please see discharge summary for a list of discharge medications.  Relevant Imaging Results:  Relevant Lab Results:   Additional Information SSN:  [id number]  Darden Dates, LCSW

## 2016-09-18 NOTE — Plan of Care (Signed)
Problem: Physical Regulation: Goal: Quality of life will improve Outcome: Not Progressing Continues to eat minimally. Encouraged to drink ensure and drink gatorade

## 2016-09-18 NOTE — Progress Notes (Signed)
qPhysical Therapy Treatment Patient Details Name: Haley Arias MRN: [id number] DOB: 08/23/1950 Today's Date: 09/18/2016    History of Present Illness 66 y.o. female with a known history of Schizophrenia metastatic lung cancer, emphysema, tobacco abuse presented to the emergency room with difficulty breathing. Patient has refused therapy for metastatic lung cancer and currently under hospice.    PT Comments    Pt with some impulsivity and quick fatigue with ~100 ft of ambulation using walker, but ultimately she did not have any LOBs and showed good confidence despite some mild safety issues.  Overall pt did well considering she has been very weak and not eating much.  Pt too tired post ambulation to do exercises, but showed good functional mobility getting out of bed and up to standing.    Follow Up Recommendations  Home health PT (home with hospice)     Equipment Recommendations       Recommendations for Other Services       Precautions / Restrictions Precautions Precautions: Fall Restrictions Weight Bearing Restrictions: No    Mobility  Bed Mobility Overal bed mobility: Modified Independent                Transfers Overall transfer level: Modified independent Equipment used: Rolling walker (2 wheeled)             General transfer comment: Pt needs cues to use walker appropriately and to insure she is safe.  Ambulation/Gait Ambulation/Gait assistance: Min assist Ambulation Distance (Feet): 100 Feet Assistive device: Rolling walker (2 wheeled)       General Gait Details: Pt was confident with ambulation using walker, however she did need cuing to slow down, keep the walker close and generally to insure she was safe.  Pt impulsive and did show considerable fatigue with the effort (HR to the 120s, O2 to low 90s)   Stairs            Wheelchair Mobility    Modified Rankin (Stroke Patients Only)       Balance                                             Cognition Arousal/Alertness: Awake/alert Behavior During Therapy: Impulsive Overall Cognitive Status: History of cognitive impairments - at baseline                                        Exercises      General Comments        Pertinent Vitals/Pain Pain Assessment: No/denies pain    Home Living                      Prior Function            PT Goals (current goals can now be found in the care plan section) Progress towards PT goals: Progressing toward goals    Frequency    Min 2X/week      PT Plan Current plan remains appropriate    Co-evaluation             End of Session Equipment Utilized During Treatment: Gait belt Activity Tolerance: Patient tolerated treatment well Patient left: with chair alarm set;with call bell/phone within reach Nurse Communication: Mobility status PT Visit Diagnosis: Muscle weakness (generalized) (  M62.81);Difficulty in walking, not elsewhere classified (R26.2)     Time: 8421-0312 PT Time Calculation (min) (ACUTE ONLY): 12 min  Charges:  $Gait Training: 8-22 mins                    G Codes:        Kreg Shropshire, DPT 09/18/2016, 5:10 PM

## 2016-09-18 NOTE — Progress Notes (Signed)
CH made a follow up visit. Pt appeared to be sleeping. CH had silent prayer at bedside. CH is available for follow up as needed.    09/18/16 1100  Clinical Encounter Type  Visited With Patient  Visit Type Follow-up  Consult/Referral To Chaplain  Spiritual Encounters  Spiritual Needs Prayer

## 2016-09-18 NOTE — Progress Notes (Signed)
[place] at [place] NAME: Haley Arias    MR#:  [id number]  DATE OF BIRTH:  Apr 27, 1951  SUBJECTIVE:  CHIEF COMPLAINT:   Chief Complaint  Patient presents with  . Shortness of Breath   - continues to have poor oral intake - Pleasant otherwise  REVIEW OF SYSTEMS:  Review of Systems  Unable to perform ROS: Psychiatric disorder    DRUG ALLERGIES:   Allergies  Allergen Reactions  . Cogentin [Benztropine] Anxiety    VITALS:  Blood pressure 139/78, pulse 92, temperature 97.9 F (36.6 C), temperature source Oral, resp. rate 17, height '5\' 2"'$  (1.575 m), weight 44.4 kg (97 lb 14.4 oz), SpO2 92 %.  PHYSICAL EXAMINATION:  Physical Exam  GENERAL:  66 y.o.-year-old ill nourished patient sitting in the Chair with no acute distress.  EYES: Pupils equal, round, reactive to light and accommodation. No scleral icterus. Extraocular muscles intact.  HEENT: Head atraumatic, normocephalic. Oropharynx and nasopharynx clear.  NECK:  Supple, no jugular venous distention. No thyroid enlargement, no tenderness.  LUNGS: Normal breath sounds bilaterally, no wheezing, rales,rhonchi or crepitation. No use of accessory muscles of respiration.  CARDIOVASCULAR: S1, S2 normal. No murmurs, rubs, or gallops.  ABDOMEN: Soft, nontender, nondistended. Bowel sounds present. No organomegaly or mass.  EXTREMITIES: No pedal edema, cyanosis, or clubbing.  NEUROLOGIC: Cranial nerves II through XII are intact. Muscle strength 5/5 in all extremities. Sensation intact. Gait not checked.  PSYCHIATRIC: The patient is alert and oriented to self, very pleasant  SKIN: No obvious rash, lesion, or ulcer.    LABORATORY PANEL:   CBC  Recent Labs Lab 09/16/16 1116  WBC 16.9*  HGB 9.3*  HCT 28.0*  PLT 540*   ------------------------------------------------------------------------------------------------------------------  Chemistries   Recent Labs Lab 09/16/16 1116    NA 133*  K 3.9  CL 96*  CO2 25  GLUCOSE 100*  BUN 31*  CREATININE 0.61  CALCIUM 9.7   ------------------------------------------------------------------------------------------------------------------  Cardiac Enzymes No results for input(s): TROPONINI in the last 168 hours. ------------------------------------------------------------------------------------------------------------------  RADIOLOGY:  No results found.  EKG:   Orders placed or performed during the hospital encounter of 09/08/16  . ED EKG  . ED EKG  . EKG 12-Lead  . EKG 12-Lead    ASSESSMENT AND PLAN:   66 year old female with past medical history significant for schizophrenia, recent diagnosis of metastatic lung cancer last week when she was admitted to behavioral medicine unit, tobacco use disorder presents to Hospital from group home secondary to difficulty breathing. She was noted to be hypoxic initially.  #1 acute hypoxic respiratory failure-secondary to right lower lobe pneumonia and pleural effusion. Also has right-sided lung cancer. -Currently on room air. Finished her antibiotics.  #2 chronic anemia-anemia of chronic disease. Stable for now  #3 hypothyroidism-elevated TSH and borderline low T4, started synthroid  #4 schizophrenia-appreciate psych consult. Continue her Zyprexa, Zoloft, trazodone, Prolixin and amantadine -Appears stable, pleasant at this time  #5 lung cancer- diagnosed with right lower lobe mass on CT chest last admission last week.  Seen by oncology and palliative care. Patient not a good candidate for chemotherapy due to compliance issues and mental disorder. And patient did not want to have any chemotherapy or further workup done for her malignancy. -After conversations with the family, patient has decided not to pursue further treatment. She is a DO NOT RESUSCITATE  #6 Severe malnutrition-Failure to thrive adult- started megace, synthroid added Monitor Could be from her  underlying malignancy  Social worker working on placement. Hospice following. If worsens at any time- consider hospice home   All the records are reviewed and case discussed with Care Management/Social Workerr. Management plans discussed with the patient, family and they are in agreement.  CODE STATUS: DNR  TOTAL TIME TAKING CARE OF THIS PATIENT: 26 minutes.   POSSIBLE D/C IN 2 DAYS, DEPENDING ON CLINICAL CONDITION.   Syriana Croslin M.D on 09/18/2016 at 9:39 AM  Between 7am to 6pm - Pager - [phone]  After 6pm go to [email or web address] - password [email or web address] [facility]  Office  [phone]  CC: Primary care physician; No PCP Per Patient

## 2016-09-18 NOTE — Clinical Social Work Note (Signed)
PASARR is pending.

## 2016-09-19 NOTE — Progress Notes (Signed)
Haley Arias at [place] NAME: Haley Arias    MR#:  [id number]  DATE OF BIRTH:  05/08/1951  SUBJECTIVE:  CHIEF COMPLAINT:   Chief Complaint  Patient presents with  . Shortness of Breath   - continues to have poor oral intake - Pleasant otherwise, BP on the lower side  REVIEW OF SYSTEMS:  Review of Systems  Unable to perform ROS: Psychiatric disorder    DRUG ALLERGIES:   Allergies  Allergen Reactions  . Cogentin [Benztropine] Anxiety    VITALS:  Blood pressure (!) 104/54, pulse (!) 109, temperature 97.4 F (36.3 C), temperature source Oral, resp. rate 20, height '5\' 2"'$  (1.575 m), weight 44.4 kg (97 lb 14.4 oz), SpO2 95 %.  PHYSICAL EXAMINATION:  Physical Exam  GENERAL:  66 y.o.-year-old ill nourished patient sitting in the Chair with no acute distress.  EYES: Pupils equal, round, reactive to light and accommodation. No scleral icterus. Extraocular muscles intact.  HEENT: Head atraumatic, normocephalic. Oropharynx and nasopharynx clear.  NECK:  Supple, no jugular venous distention. No thyroid enlargement, no tenderness.  LUNGS: Normal breath sounds bilaterally, no wheezing, rales,rhonchi or crepitation. No use of accessory muscles of respiration.  CARDIOVASCULAR: S1, S2 normal. No  rubs, or gallops. 2/6 systolic murmur present ABDOMEN: Soft, nontender, nondistended. Bowel sounds present. No organomegaly or mass.  EXTREMITIES: No pedal edema, cyanosis, or clubbing.  NEUROLOGIC: Cranial nerves II through XII are intact. Muscle strength 5/5 in all extremities. Sensation intact. Gait not checked. Global weakness noted. PSYCHIATRIC: The patient is alert and oriented to self, very pleasant  SKIN: No obvious rash, lesion, or ulcer.    LABORATORY PANEL:   CBC  Recent Labs Lab 09/16/16 1116  WBC 16.9*  HGB 9.3*  HCT 28.0*  PLT 540*    ------------------------------------------------------------------------------------------------------------------  Chemistries   Recent Labs Lab 09/16/16 1116  NA 133*  K 3.9  CL 96*  CO2 25  GLUCOSE 100*  BUN 31*  CREATININE 0.61  CALCIUM 9.7   ------------------------------------------------------------------------------------------------------------------  Cardiac Enzymes No results for input(s): TROPONINI in the last 168 hours. ------------------------------------------------------------------------------------------------------------------  RADIOLOGY:  No results found.  EKG:   Orders placed or performed during the hospital encounter of 09/08/16  . ED EKG  . ED EKG  . EKG 12-Lead  . EKG 12-Lead    ASSESSMENT AND PLAN:   66 year old female with past medical history significant for schizophrenia, recent diagnosis of metastatic lung cancer last week when she was admitted to behavioral medicine unit, tobacco use disorder presents to Hospital from group home secondary to difficulty breathing. She was noted to be hypoxic initially.  #1 acute hypoxic respiratory failure-secondary to right lower lobe pneumonia and pleural effusion. Also has right-sided lung cancer. -Currently on room air. Finished her antibiotics.  #2 chronic anemia-anemia of chronic disease. Stable for now  #3 hypothyroidism-elevated TSH and borderline low T4, started synthroid  #4 schizophrenia-appreciate psych consult. Continue her Zyprexa, Zoloft, trazodone, Prolixin and amantadine -Appears stable, pleasant at this time  #5 lung cancer- diagnosed with right lower lobe mass on CT chest last admission last week.  Seen by oncology and palliative care. Patient not a good candidate for chemotherapy due to compliance issues and mental disorder. And patient did not want to have any chemotherapy or further workup done for her malignancy. -After conversations with the family, patient has decided not to  pursue further treatment. She is a DO NOT RESUSCITATE  #6 Severe malnutrition-Failure to thrive adult- started  megace, synthroid added Monitor Could be from her underlying malignancy Continues to have poor po intake   Education officer, museum working on placement. Hospice following. If worsens at any time- consider hospice home   All the records are reviewed and case discussed with Care Management/Social Workerr. Management plans discussed with the patient, family and they are in agreement.  CODE STATUS: DNR  TOTAL TIME TAKING CARE OF THIS PATIENT: 26 minutes.   POSSIBLE D/C ONCE PLACEMENT IS CONFIRMED, DEPENDING ON CLINICAL CONDITION.   Gladstone Lighter M.D on 09/19/2016 at 10:41 AM  Between 7am to 6pm - Pager - [phone]  After 6pm go to [email or web address] - password [email or web address] [facility]  Office  [phone]  CC: Primary care physician; No PCP Per Patient

## 2016-09-19 NOTE — Progress Notes (Signed)
While visiting Patients in the unit, the Chaplain met with the Haley Arias whom a Nurse wheeled around the unit on the wheelchair. Haley Arias asked [facility] to visit with her. [place] visited with the Haley Arias. Haley Arias stated she would be going to a group home on Monday and asked for prayers for strength, for nurses who are taking care of her, and for everything to go well. [place] prayed for all the concerns the Haley Arias presented. This was an initial visit with the Haley Arias. Haley Arias was in good spirit. [place] told the Haley Arias that he is available as needed.    09/19/16 1600  Clinical Encounter Type  Visited With Patient  Visit Type Initial;Follow-up  Referral From Patient  Consult/Referral To Chaplain  Spiritual Encounters  Spiritual Needs Prayer

## 2016-09-20 NOTE — Progress Notes (Signed)
Pt slept very comfortably for most of the day. She did wake up to drink an ensure and take medications. She has no complaints of pain and is very cheerful when awake. Ammie Dalton, RN

## 2016-09-20 NOTE — Progress Notes (Signed)
[facility] at [place] NAME: Haley Arias    MR#:  [id number]  DATE OF BIRTH:  1951/05/20  SUBJECTIVE:  CHIEF COMPLAINT:   Chief Complaint  Patient presents with  . Shortness of Breath   - continues to have poor oral intake - doing better with soups and jellos  REVIEW OF SYSTEMS:  Review of Systems  Unable to perform ROS: Psychiatric disorder    DRUG ALLERGIES:   Allergies  Allergen Reactions  . Cogentin [Benztropine] Anxiety    VITALS:  Blood pressure (!) 108/58, pulse (!) 110, temperature 98.6 F (37 C), resp. rate 19, height '5\' 2"'$  (1.575 m), weight 44.4 kg (97 lb 14.4 oz), SpO2 93 %.  PHYSICAL EXAMINATION:  Physical Exam  GENERAL:  66 y.o.-year-old ill nourished patient sitting in the Chair with no acute distress.  EYES: Pupils equal, round, reactive to light and accommodation. No scleral icterus. Extraocular muscles intact.  HEENT: Head atraumatic, normocephalic. Oropharynx and nasopharynx clear.  NECK:  Supple, no jugular venous distention. No thyroid enlargement, no tenderness.  LUNGS: Normal breath sounds bilaterally, no wheezing, rales,rhonchi or crepitation. No use of accessory muscles of respiration.  CARDIOVASCULAR: S1, S2 normal. No  rubs, or gallops. 2/6 systolic murmur present ABDOMEN: Soft, nontender, nondistended. Bowel sounds present. No organomegaly or mass.  EXTREMITIES: No pedal edema, cyanosis, or clubbing.  NEUROLOGIC: Cranial nerves II through XII are intact. Muscle strength 5/5 in all extremities. Sensation intact. Gait not checked. Global weakness noted. PSYCHIATRIC: The patient is alert and oriented to self, very pleasant  SKIN: No obvious rash, lesion, or ulcer.    LABORATORY PANEL:   CBC  Recent Labs Lab 09/16/16 1116  WBC 16.9*  HGB 9.3*  HCT 28.0*  PLT 540*    ------------------------------------------------------------------------------------------------------------------  Chemistries   Recent Labs Lab 09/16/16 1116  NA 133*  K 3.9  CL 96*  CO2 25  GLUCOSE 100*  BUN 31*  CREATININE 0.61  CALCIUM 9.7   ------------------------------------------------------------------------------------------------------------------  Cardiac Enzymes No results for input(s): TROPONINI in the last 168 hours. ------------------------------------------------------------------------------------------------------------------  RADIOLOGY:  No results found.  EKG:   Orders placed or performed during the hospital encounter of 09/08/16  . ED EKG  . ED EKG  . EKG 12-Lead  . EKG 12-Lead    ASSESSMENT AND PLAN:   66 year old female with past medical history significant for schizophrenia, recent diagnosis of metastatic lung cancer last week when she was admitted to behavioral medicine unit, tobacco use disorder presents to Hospital from group home secondary to difficulty breathing. She was noted to be hypoxic initially.  #1 acute hypoxic respiratory failure-secondary to right lower lobe pneumonia and pleural effusion. Also has right-sided lung cancer. -Currently on room air. Finished her antibiotics.  #2 chronic anemia-anemia of chronic disease. Stable for now  #3 hypothyroidism-elevated TSH and borderline low T4, started synthroid  #4 schizophrenia-appreciate psych consult. Continue her Zyprexa, Zoloft, trazodone, Prolixin and amantadine -Appears stable, pleasant at this time  #5 lung cancer- diagnosed with right lower lobe mass on CT chest last admission last week.  Seen by oncology and palliative care. Patient not a good candidate for chemotherapy due to compliance issues and mental disorder. And patient did not want to have any chemotherapy or further workup done for her malignancy. -After conversations with the family, patient has decided not to  pursue further treatment. She is a DO NOT RESUSCITATE  #6 Severe malnutrition-Failure to thrive adult- started megace, synthroid added Monitor  Could be from her underlying malignancy Continues to have poor po intake- likes soups and jellos   Education officer, museum working on placement. Hospice following. Likely group home tomorrow If worsens at any time- consider hospice home   All the records are reviewed and case discussed with Care Management/Social Workerr. Management plans discussed with the patient, family and they are in agreement.  CODE STATUS: DNR  TOTAL TIME TAKING CARE OF THIS PATIENT: 26 minutes.   POSSIBLE D/C ONCE PLACEMENT IS CONFIRMED, DEPENDING ON CLINICAL CONDITION.   Gladstone Lighter M.D on 09/20/2016 at 7:41 AM  Between 7am to 6pm - Pager - [phone]  After 6pm go to [email or web address] - password [email or web address] [facility]  Office  [phone]  CC: Primary care physician; No PCP Per Patient

## 2016-09-21 MED ORDER — MEGESTROL ACETATE 400 MG/10ML PO SUSP
400.0000 mg | Freq: Every day | ORAL | Status: DC
  Administered 2016-09-21 – 2016-09-22 (×2): 400 mg via ORAL
  Filled 2016-09-21 (×3): qty 10

## 2016-09-21 NOTE — Progress Notes (Signed)
[place] at [place] NAME: Haley Arias    MR#:  [id number]  DATE OF BIRTH:  20-Oct-1950  SUBJECTIVE:  CHIEF COMPLAINT:   Chief Complaint  Patient presents with  . Shortness of Breath   - continues to have poor oral intake - no complaints  REVIEW OF SYSTEMS:  Review of Systems  Unable to perform ROS: Psychiatric disorder    DRUG ALLERGIES:   Allergies  Allergen Reactions  . Cogentin [Benztropine] Anxiety    VITALS:  Blood pressure (!) 89/58, pulse (!) 108, temperature 98.3 F (36.8 C), resp. rate 18, height '5\' 2"'$  (1.575 m), weight 44.4 kg (97 lb 14.4 oz), SpO2 97 %.  PHYSICAL EXAMINATION:  Physical Exam  GENERAL:  66 y.o.-year-old ill nourished patient sitting in the Chair with no acute distress.  EYES: Pupils equal, round, reactive to light and accommodation. No scleral icterus. Extraocular muscles intact.  HEENT: Head atraumatic, normocephalic. Oropharynx and nasopharynx clear.  NECK:  Supple, no jugular venous distention. No thyroid enlargement, no tenderness.  LUNGS: Normal breath sounds bilaterally, no wheezing, rales,rhonchi or crepitation. No use of accessory muscles of respiration.  CARDIOVASCULAR: S1, S2 normal. No  rubs, or gallops. 2/6 systolic murmur present ABDOMEN: Soft, nontender, nondistended. Bowel sounds present. No organomegaly or mass.  EXTREMITIES: No pedal edema, cyanosis, or clubbing.  NEUROLOGIC: Cranial nerves II through XII are intact. Muscle strength 5/5 in all extremities. Sensation intact. Gait not checked. Global weakness noted. PSYCHIATRIC: The patient is alert and oriented to self, very pleasant  SKIN: No obvious rash, lesion, or ulcer.    LABORATORY PANEL:   CBC  Recent Labs Lab 09/16/16 1116  WBC 16.9*  HGB 9.3*  HCT 28.0*  PLT 540*   ------------------------------------------------------------------------------------------------------------------  Chemistries   Recent  Labs Lab 09/16/16 1116  NA 133*  K 3.9  CL 96*  CO2 25  GLUCOSE 100*  BUN 31*  CREATININE 0.61  CALCIUM 9.7   ------------------------------------------------------------------------------------------------------------------  Cardiac Enzymes No results for input(s): TROPONINI in the last 168 hours. ------------------------------------------------------------------------------------------------------------------  RADIOLOGY:  No results found.  EKG:   Orders placed or performed during the hospital encounter of 09/08/16  . ED EKG  . ED EKG  . EKG 12-Lead  . EKG 12-Lead    ASSESSMENT AND PLAN:   66 year old female with past medical history significant for schizophrenia, recent diagnosis of metastatic lung cancer last week when she was admitted to behavioral medicine unit, tobacco use disorder presents to Hospital from group home secondary to difficulty breathing. She was noted to be hypoxic initially.  #1 acute hypoxic respiratory failure-secondary to right lower lobe pneumonia and pleural effusion. Also has right-sided lung cancer. -Currently on room air. Finished her antibiotics.  #2 chronic anemia-anemia of chronic disease. Stable for now  #3 hypothyroidism-elevated TSH and borderline low T4, started synthroid  #4 schizophrenia-appreciate psych consult. Continue her Zyprexa, Zoloft, trazodone, Prolixin and amantadine -Appears stable, pleasant at this time  #5 lung cancer- diagnosed with right lower lobe mass on CT chest last admission last week.  Seen by oncology and palliative care. Patient not a good candidate for chemotherapy due to compliance issues and mental disorder. And patient did not want to have any chemotherapy or further workup done for her malignancy. -After conversations with the family, patient has decided not to pursue further treatment. She is a DO NOT RESUSCITATE  #6 Severe malnutrition-Failure to thrive adult- started megace, synthroid  added Monitor Could be from her  underlying malignancy Continues to have poor po intake- likes soups and jellos   Education officer, museum working on placement. Hospice following. Likely group home once placement available  All the records are reviewed and case discussed with Care Management/Social Workerr. Management plans discussed with the patient, family and they are in agreement.  CODE STATUS: DNR  TOTAL TIME TAKING CARE OF THIS PATIENT: 26 minutes.   POSSIBLE D/C ONCE PLACEMENT IS CONFIRMED, DEPENDING ON CLINICAL CONDITION.   Gladstone Lighter M.D on 09/21/2016 at 12:04 PM  Between 7am to 6pm - Pager - [phone]  After 6pm go to [email or web address] - password [email or web address] [facility]  Office  [phone]  CC: Primary care physician; No PCP Per Patient No ovaries a liver

## 2016-09-21 NOTE — Progress Notes (Signed)
Justice made a follow-up visit with the Pt whom Royal Kunia visited on Saturday. Pt was sleeping at the time of this visit, so the [facility] was not able to have a conversation with the Pt. CH offered a silent prayer for the Pt and plans to make a follow-up visit with this Pt at another time.     09/21/16 1400  Clinical Encounter Type  Visited With Patient  Visit Type Follow-up  Referral From Chaplain  Consult/Referral To Chaplain  Spiritual Encounters  Spiritual Needs Prayer;Emotional;Other (Comment)

## 2016-09-21 NOTE — Progress Notes (Signed)
Nutrition Follow-up  DOCUMENTATION CODES:   Severe malnutrition in context of chronic illness, Underweight  INTERVENTION:  Continue Ensure Enlive po TID, each supplement provides 350 kcal and 20 grams of protein.  Recommend 1:1 feeding assistance at all meals.  NUTRITION DIAGNOSIS:   Malnutrition (Severe) related to chronic illness (Metastatic lung cancer) as evidenced by severe depletion of body fat, severe depletion of muscle mass.  Ongoing.   GOAL:   Other (Comment) (Patient will consume as many calories as possible)  Progressing - patient drinking Ensure Enlive.  MONITOR:   PO intake, Supplement acceptance, Labs, Weight trends, I & O's  REASON FOR ASSESSMENT:   Malnutrition Screening Tool    ASSESSMENT:   66 y.o. female with a known history of Schizophrenia metastatic lung cancer, emphysema, tobacco abuse presented to the emergency room with difficulty breathing. Patient has refused therapy for metastatic lung cancer and currently under hospice.   -Per chart patient will likely discharge to a group home pending placement.   Spoke with patient at bedside. Per chart patient completing 0% of meals and with ongoing poor PO intake. Patient likely confused as on visit she reported that she has a good appetite and is eating most of her meals. She did report she has been sleepy lately or else her appetite would be even better. Denies N/V or abdominal pain. Patient reports she enjoys Ensure and would like to continue drinking these. As patient may discharge to group home she would benefit on education on how to get enough calories and protein in diet without Ensure Enlive (will likely not be available there), but she is too confused at this time.   Meal Completion: per chart 0% meal completion last 24 hours, but patient is consuming liquids on trays. Patient reports she drank all of her Ensure Enlive.  In the past 24 hours she has had approximately 1254 kcal (81% minimum estimated  kcal needs) and 60 grams of protein (100% estimated protein needs).  Medications reviewed and include: levothyroxine, Megace 400 mg daily.  No labs since 09/16/2016.   No weights to trend. Patient sitting on chair so unable to obtain bed scale weight.   Diet Order:  DIET FINGER FOODS Room service appropriate? Yes; Fluid consistency: Thin  Skin:  Reviewed, no issues  Last BM:  3/26  Height:   Ht Readings from Last 1 Encounters:  09/09/16 '5\' 2"'$  (1.575 m)    Weight:   Wt Readings from Last 1 Encounters:  09/09/16 97 lb 14.4 oz (44.4 kg)    Ideal Body Weight:  50 kg (Per stated height)  BMI:  Body mass index is 17.91 kg/m.  Estimated Nutritional Needs:   Kcal:  0865-7846  Protein:  50-60  Fluid:  >/= 1.5 L/d  EDUCATION NEEDS:   Education needs no appropriate at this time  Willey Blade, MS, RD, LDN Pager: [phone] After Hours Pager: [phone]

## 2016-09-21 NOTE — Progress Notes (Signed)
qPhysical Therapy Treatment Patient Details Name: Haley Arias MRN: [id number] DOB: Dec 17, 1950 Today's Date: 09/21/2016    History of Present Illness 66 y.o. female with a known history of Schizophrenia metastatic lung cancer, emphysema, tobacco abuse presented to the emergency room with difficulty breathing. Patient has refused therapy for metastatic lung cancer and currently under hospice.    PT Comments    Pt agreeable to PT; reports pain in her back, which she does not specify location or quantify when questioned. Pt notes pain several times during session; when pain noted pt abruptly stops activity and lies down noting she needs a rest. Pt mildly impulsive throughout session and various tasks. Participates in seated exercises edge of bed. Pt initially wishes to ambulate without rolling walker; pt demonstrates unsteadiness with furniture walking. Second walk suggested rolling walker; pt continues to be unsteady due to poor use of rolling walker and difficulty maneuvering around objects. Pt receives up in chair comfortably. Continue PT to progress endurance, strength and safety issues to improve functional mobility.    Follow Up Recommendations  Home health PT     Equipment Recommendations       Recommendations for Other Services       Precautions / Restrictions Precautions Precautions: Fall Restrictions Weight Bearing Restrictions: No    Mobility  Bed Mobility Overal bed mobility: Modified Independent             General bed mobility comments: use of rails  Transfers Overall transfer level: Needs assistance Equipment used: Rolling walker (2 wheeled);None Transfers: Sit to/from Stand Sit to Stand: Supervision;Min guard         General transfer comment: cues for hand placement with rw and supervision. without rw min guard. performed several times as pt notes some dizziness or increased pain in back requiring supine rest break  Ambulation/Gait Ambulation/Gait  assistance: Min assist Ambulation Distance (Feet): 20 Feet (40 ft with rw) Assistive device: Rolling walker (2 wheeled);1 person hand held assist     Gait velocity interpretation: Below normal speed for age/gender General Gait Details: Without rw, pt requires min A due to impulsivity and furniture walking. With rw pt continues to demonstrate impulsivity and poor use of rw running into objects and picking up. pt did not have glasses on, but does not wish to wear them either.    Stairs            Wheelchair Mobility    Modified Rankin (Stroke Patients Only)       Balance Overall balance assessment: Needs assistance Sitting-balance support: Feet supported;Bilateral upper extremity supported Sitting balance-Leahy Scale: Good     Standing balance support: Bilateral upper extremity supported;No upper extremity supported Standing balance-Leahy Scale: Fair                              Cognition Arousal/Alertness: Awake/alert Behavior During Therapy: Impulsive Overall Cognitive Status: History of cognitive impairments - at baseline                                        Exercises General Exercises - Lower Extremity Long Arc Quad: AROM;Both;10 reps;Seated (2 sets) Hip Flexion/Marching: AROM;Both;20 reps;Seated Toe Raises: AROM;Both;20 reps;Seated Heel Raises: AROM;Both;20 reps;Seated    General Comments        Pertinent Vitals/Pain Pain Assessment:  (Reports in back; does not specify or quantify)  Home Living                      Prior Function            PT Goals (current goals can now be found in the care plan section) Progress towards PT goals: Progressing toward goals    Frequency    Min 2X/week      PT Plan Current plan remains appropriate    Co-evaluation             End of Session Equipment Utilized During Treatment: Gait belt Activity Tolerance: Patient limited by pain;Patient tolerated treatment  well (reports pain several times; pt requests supine rest) Patient left: in chair;with chair alarm set;with call bell/phone within reach   PT Visit Diagnosis: Muscle weakness (generalized) (M62.81);Difficulty in walking, not elsewhere classified (R26.2)     Time: 2683-4196 PT Time Calculation (min) (ACUTE ONLY): 33 min  Charges:  $Gait Training: 8-22 mins $Therapeutic Exercise: 8-22 mins                    G Codes:        Larae Grooms, PTA 09/21/2016, 2:31 PM

## 2016-09-21 NOTE — Progress Notes (Signed)
Visit made. Patient awake, appears weak, remains with poor appetite. Per chart note review she was up to a wheel chair this weekend. She did complain of left sided pain, reported to staff RN Stanton Kidney. CSW Darden Dates continues discharge planning, awaiting PASSAR. Will continue to follow and update hospice team. Flo Shanks RN, BSN, [facility], hospital Liaison [phone] c

## 2016-09-22 MED ORDER — BISACODYL 10 MG RE SUPP
10.0000 mg | Freq: Once | RECTAL | Status: AC
  Administered 2016-09-22: 11:00:00 10 mg via RECTAL
  Filled 2016-09-22: qty 1

## 2016-09-22 MED ORDER — MEGESTROL ACETATE 400 MG/10ML PO SUSP: 400.0000 mg | Freq: Every day | ORAL | 0 refills | Status: AC

## 2016-09-22 MED ORDER — LEVOTHYROXINE SODIUM 50 MCG PO TABS: 50.0000 ug | ORAL_TABLET | Freq: Every day | ORAL | 0 refills | Status: AC

## 2016-09-22 NOTE — Discharge Summary (Signed)
[facility] [place] NAME: Haley Arias    MR#:  [id number]  DATE OF BIRTH:  1950/10/28  DATE OF ADMISSION:  09/08/2016 ADMITTING PHYSICIAN: Saundra Shelling, MD  DATE OF DISCHARGE: 09/22/2016  PRIMARY CARE PHYSICIAN: No PCP Per Patient    ADMISSION DIAGNOSIS:  Hypoxia [R09.02] Sepsis ([place]) [A41.9] Primary malignant neoplasm of right lung metastatic to other site ([place]) [C34.91] Schizophrenia, unspecified type ([place]) [F20.9] Sepsis, due to unspecified organism ([place]) [A41.9] Community acquired pneumonia, unspecified laterality [J18.9]  DISCHARGE DIAGNOSIS:  * Acute hypoxic respiratory failure due to Right LL pneumonia/pleural effusion Right LL lung mass likely cancer (no further w/u due to pt and family request) Anemia of chronic disease Chronic Schizophrenia  SECONDARY DIAGNOSIS:   Past Medical History:  Diagnosis Date  . Cancer ([place])   . COPD (chronic obstructive pulmonary disease) ([place])   . Schizophrenia ([place])   . Tobacco use     HOSPITAL COURSE:   66 year old female with past medical history significant for schizophrenia, recent diagnosis of metastatic lung cancer last week when she was admitted to behavioral medicine unit, tobacco use disorder presents to Hospital from group home secondary to difficulty breathing. She was noted to be hypoxic initially.  #1 acute hypoxic respiratory failure-secondary to right lower lobe pneumonia and pleural effusion. Also has right-sided lung cancer. -Currently on room air. Finished her antibiotics.  #2 chronic anemia-anemia of chronic disease. Stable for now  #3 hypothyroidism-elevated TSH and borderline low T4, started synthroid  #4 schizophrenia-appreciate psych consult on 09/09/16 - Continue her Zyprexa, Zoloft, trazodone, Prolixin and amantadine -Appears stable, pleasant at this time  #5 lung cancer- diagnosed with right lower lobe mass on CT chest last admission last week.   Seen by oncology and palliative care. Patient not a good candidate for chemotherapy due to compliance issues and mental disorder. And patient did not want to have any chemotherapy or further workup done for her malignancy. -After conversations with the family, patient has decided not to pursue further treatment. She is a DO NOT RESUSCITATE  #6 Severe malnutrition-Failure to thrive adult- started megace, synthroid added Monitor Could be from her underlying malignancy Continues to have poor po intake- likes soups and jellos   Education officer, museum working on placement.. Likely group home /STR placement once PASSAR available.    CONSULTS OBTAINED:  Treatment Team:  Gonzella Lex, MD  DRUG ALLERGIES:   Allergies  Allergen Reactions  . Cogentin [Benztropine] Anxiety    DISCHARGE MEDICATIONS:   Current Discharge Medication List    START taking these medications   Details  megestrol (MEGACE) 400 MG/10ML suspension Take 10 mLs (400 mg total) by mouth daily. Qty: 240 mL, Refills: 0      CONTINUE these medications which have CHANGED   Details  levothyroxine (SYNTHROID, LEVOTHROID) 50 MCG tablet Take 1 tablet (50 mcg total) by mouth daily before breakfast. Qty: 30 tablet, Refills: 0      CONTINUE these medications which have NOT CHANGED   Details  amantadine (SYMMETREL) 100 MG capsule Take 1 capsule (100 mg total) by mouth at bedtime. Qty: 30 capsule, Refills: 0    feeding supplement, ENSURE ENLIVE, (ENSURE ENLIVE) LIQD Take 237 mLs by mouth 4 (four) times daily - after meals and at bedtime. Qty: 237 mL, Refills: 12    fluPHENAZine (PROLIXIN) 5 MG tablet Take 1 tablet (5 mg total) by mouth at bedtime. Qty: 30 tablet, Refills: 0    LORazepam (ATIVAN) 0.5  MG tablet Take 1 tablet (0.5 mg total) by mouth at bedtime. Qty: 30 tablet, Refills: 0    OLANZapine (ZYPREXA) 7.5 MG tablet Take 7.5 mg by mouth daily.    sertraline (ZOLOFT) 100 MG tablet Take 100 mg by mouth daily.     traZODone (DESYREL) 50 MG tablet Take 50 mg by mouth at bedtime.      STOP taking these medications     fluPHENAZine decanoate (PROLIXIN) 25 MG/ML injection      albuterol (PROVENTIL HFA;VENTOLIN HFA) 108 (90 Base) MCG/ACT inhaler      clopidogrel (PLAVIX) 75 MG tablet      ezetimibe (ZETIA) 10 MG tablet      mometasone-formoterol (DULERA) 200-5 MCG/ACT AERO      simvastatin (ZOCOR) 80 MG tablet         If you experience worsening of your admission symptoms, develop shortness of breath, life threatening emergency, suicidal or homicidal thoughts you must seek medical attention immediately by calling 911 or calling your MD immediately  if symptoms less severe.  You Must read complete instructions/literature along with all the possible adverse reactions/side effects for all the Medicines you take and that have been prescribed to you. Take any new Medicines after you have completely understood and accept all the possible adverse reactions/side effects.   Please note  You were cared for by a hospitalist during your hospital stay. If you have any questions about your discharge medications or the care you received while you were in the hospital after you are discharged, you can call the unit and asked to speak with the hospitalist on call if the hospitalist that took care of you is not available. Once you are discharged, your primary care physician will handle any further medical issues. Please note that NO REFILLS for any discharge medications will be authorized once you are discharged, as it is imperative that you return to your primary care physician (or establish a relationship with a primary care physician if you do not have one) for your aftercare needs so that they can reassess your need for medications and monitor your lab values. Today   SUBJECTIVE   Resting quietly no complaints  VITAL SIGNS:  Blood pressure (!) 91/55, pulse (!) 102, temperature 97.6 F (36.4 C), resp. rate  18, height '5\' 2"'$  (1.575 m), weight 44.4 kg (97 lb 14.4 oz), SpO2 95 %.  I/O:    Intake/Output Summary (Last 24 hours) at 09/22/16 0728 Last data filed at 09/22/16 0520  Gross per 24 hour  Intake              460 ml  Output                0 ml  Net              460 ml    PHYSICAL EXAMINATION:  GENERAL:  66 y.o.-year-old patient lying in the bed with no acute distress. Thin and lean EYES: Pupils equal, round, reactive to light and accommodation. No scleral icterus. Extraocular muscles intact.  HEENT: Head atraumatic, normocephalic. Oropharynx and nasopharynx clear.  NECK:  Supple, no jugular venous distention. No thyroid enlargement, no tenderness.  LUNGS: Normal breath sounds bilaterally, no wheezing, rales,rhonchi or crepitation. No use of accessory muscles of respiration.  CARDIOVASCULAR: S1, S2 normal. No murmurs, rubs, or gallops.  ABDOMEN: Soft, non-tender, non-distended. Bowel sounds present. No organomegaly or mass.  EXTREMITIES: No pedal edema, cyanosis, or clubbing.  NEUROLOGIC: Cranial nerves II through XII  are intact. Muscle strength 5/5 in all extremities. Sensation intact. Gait not checked.  PSYCHIATRIC: The patient is alert but sleepy SKIN: No obvious rash, lesion, or ulcer.   DATA REVIEW:   CBC   Recent Labs Lab 09/16/16 1116  WBC 16.9*  HGB 9.3*  HCT 28.0*  PLT 540*    Chemistries   Recent Labs Lab 09/16/16 1116  NA 133*  K 3.9  CL 96*  CO2 25  GLUCOSE 100*  BUN 31*  CREATININE 0.61  CALCIUM 9.7    Microbiology Results   No results found for this or any previous visit (from the past 240 hour(s)).  RADIOLOGY:  No results found.   Management plans discussed with the patient, family and they are in agreement.  CODE STATUS:     Code Status Orders        Start     Ordered   09/09/16 0231  Do not attempt resuscitation (DNR)  Continuous    Question Answer Comment  In the event of cardiac or respiratory ARREST Do not call a "code blue"    In the event of cardiac or respiratory ARREST Do not perform Intubation, CPR, defibrillation or ACLS   In the event of cardiac or respiratory ARREST Use medication by any route, position, wound care, and other measures to relive pain and suffering. May use oxygen, suction and manual treatment of airway obstruction as needed for comfort.      09/09/16 0232    Code Status History    Date Active Date Inactive Code Status Order ID Comments User Context   08/30/2016 11:18 AM 09/04/2016  5:34 PM DNR [id number]  Hildred Priest, MD Inpatient   08/28/2016 12:30 PM 08/30/2016 11:18 AM Full Code [id number]  Hildred Priest, MD Inpatient    Advance Directive Documentation     Most Recent Value  Type of Advance Directive  Healthcare Power of [place], Out of facility DNR (pink MOST or yellow form)  Pre-existing out of facility DNR order (yellow form or pink MOST form)  -  "MOST" Form in Place?  -      TOTAL TIME TAKING CARE OF THIS PATIENT: 40 minutes.    Davis Vannatter M.D on 09/22/2016 at 7:28 AM  Between 7am to 6pm - Pager - [phone] After 6pm go to [email or web address] - password [email or web address] [facility]  Office  [phone]  CC: Primary care physician; No PCP Per Patient

## 2016-09-22 NOTE — Discharge Instructions (Signed)
Pt to follow up with PCP

## 2016-09-22 NOTE — Progress Notes (Signed)
Visit made. Patient seen lying in bed, eyes closed, easily awakened by voice. She reports "I am leaving today". She ate little breakfast, continues drinking her Ensure, taking her oral medications. She has required no PRN's  for pain or dyspnea. PRN dulcolax suppository to be given this morning as no bowel movement noted since 3/22. Writer spoke via telephone to patient's brother Milta Deiters, who is aware of discharge plan. He is also aware of need the revocation of Hospice benefit form that needs to be signed, he stated that he would come to [facility] today and meet with Probation officer. Will continue t follow through discharge. Thank you. Flo Shanks RN, BSN, [facility], hospital liaison [phone] c

## 2016-09-22 NOTE — Progress Notes (Signed)
Pt is d/ced to [facility] and [place].  Called report to [place] at [phone].  Patient continues to have very poor appetite.  She will be followed by Hospice there. Communicated allergies, high risk meds and when they're next due; activity level - 1 asst to [facility].  Pt had very small, hard BM after administering dulcolax suppository.  Patient being transported by her brother, Milta Deiters.

## 2016-10-20 DEATH — deceased

## 2017-07-15 IMAGING — CT CT CHEST W/ CM
2 of 6 series · 14 of 36 positions shown, 18 images · IV contrast (iopamidol)
Comparison: Chest x-ray from earlier today.

CLINICAL DATA: Chest mass seen on x-ray.

EXAM:
CT CHEST WITH CONTRAST
TECHNIQUE: Multidetector CT imaging of the chest was performed during
intravenous contrast administration.
CONTRAST:  60mL DV3OT3-CAA IOPAMIDOL (DV3OT3-CAA) INJECTION 61%

[Series 2: axial st · axial · 0.68mm/px · z∈[-526,-252]mm · 13 of 153 slices shown, 17 images]
[im 8/153  mediastinal]
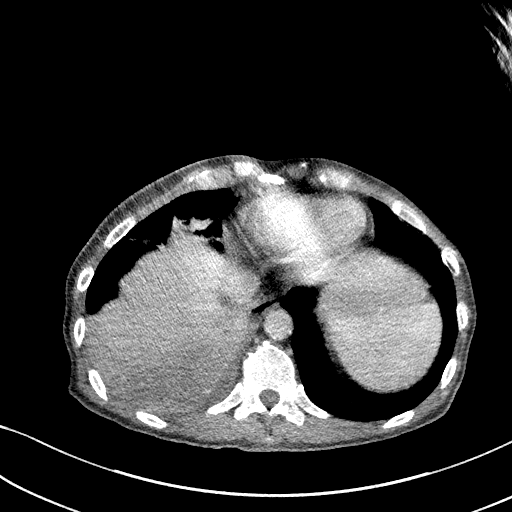
[im 8/153  lung]
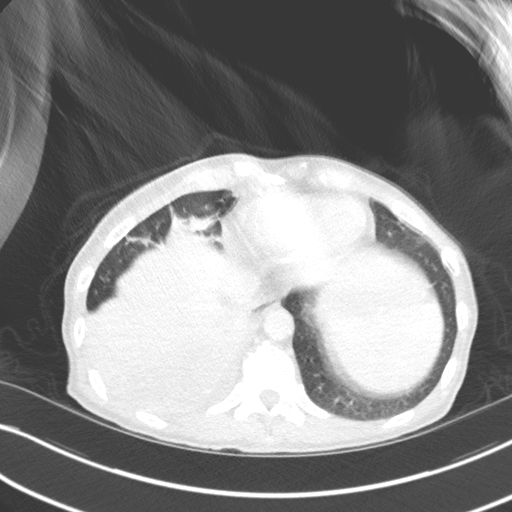
[im 23/153  lung]
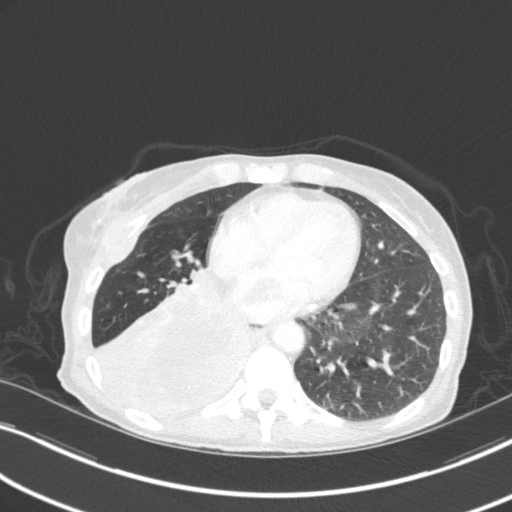
[im 31/153  lung]
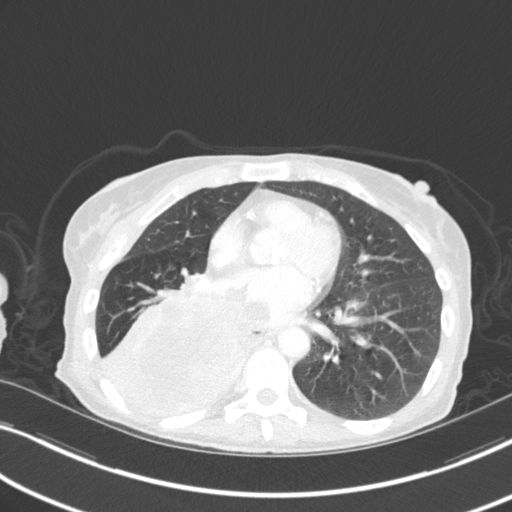
[im 46/153  lung]
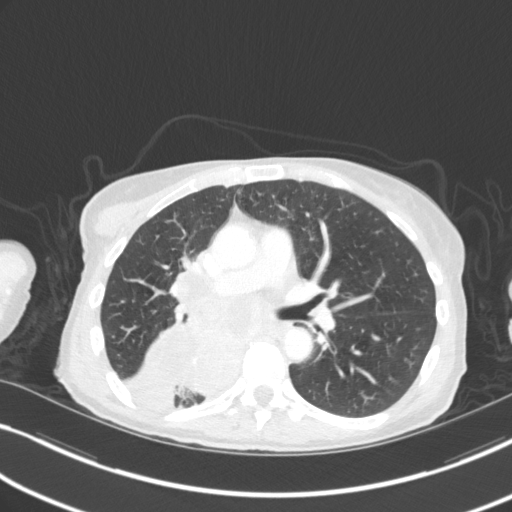
[im 54/153  mediastinal]
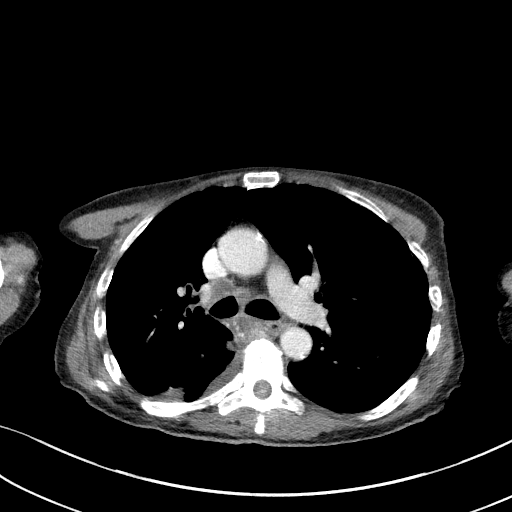
[im 54/153  lung]
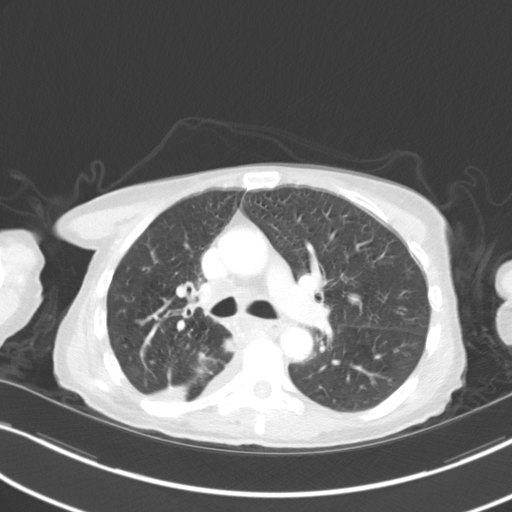
[im 69/153  lung]
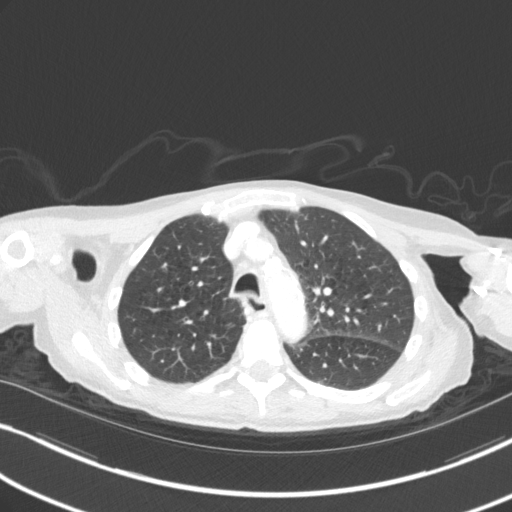
[im 77/153  lung]
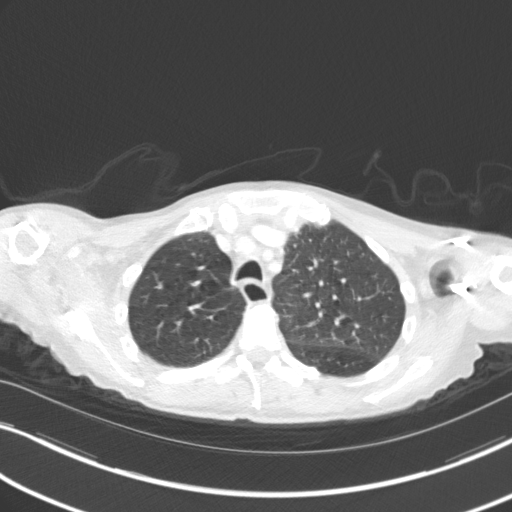
[im 84/153  lung]
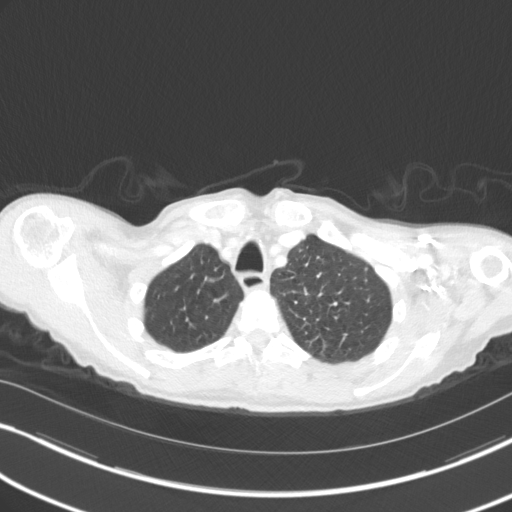
[im 99/153  mediastinal]
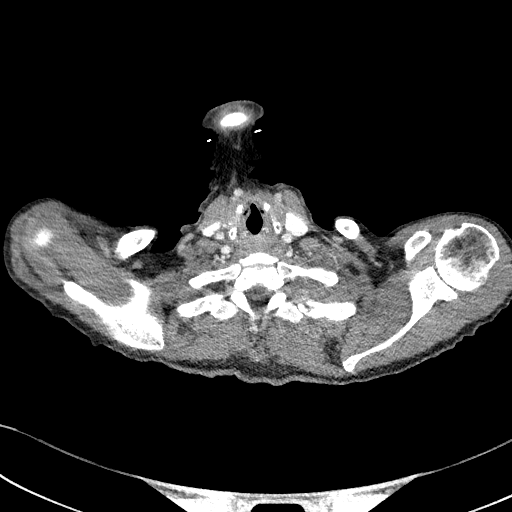
[im 99/153  lung]
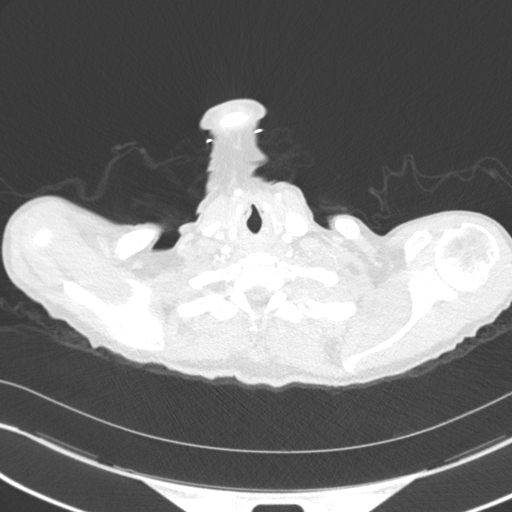
[im 107/153  lung]
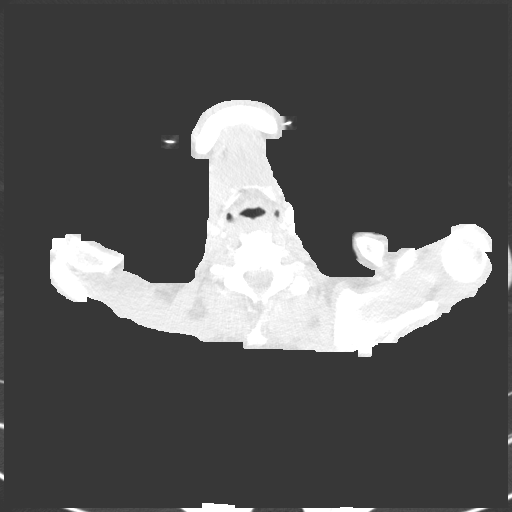
[im 122/153  lung]
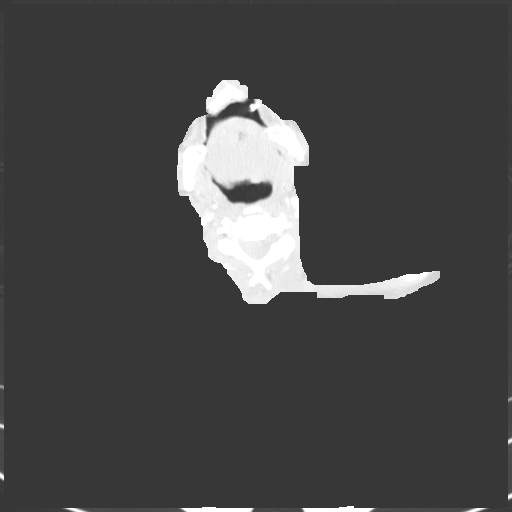
[im 130/153  lung]
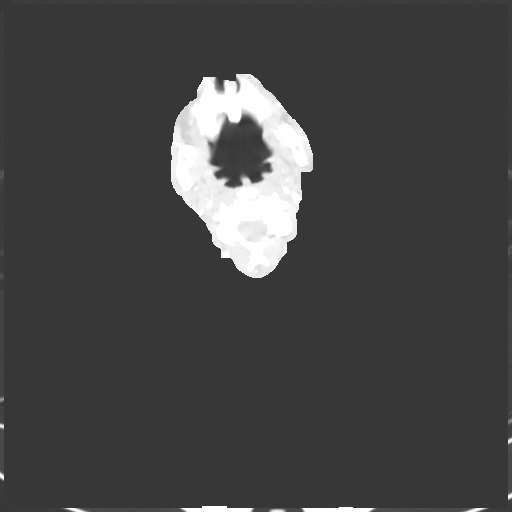
[im 145/153  mediastinal]
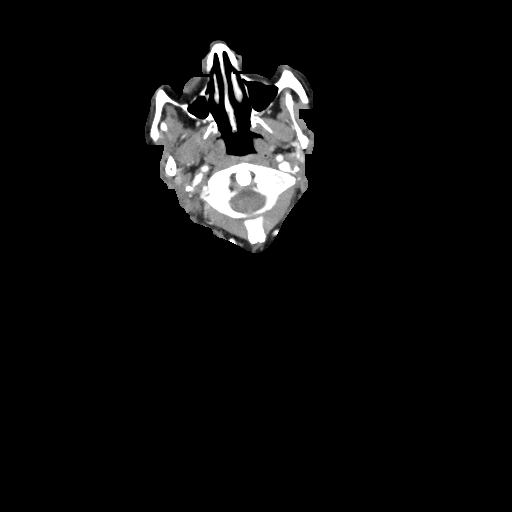
[im 145/153  lung]
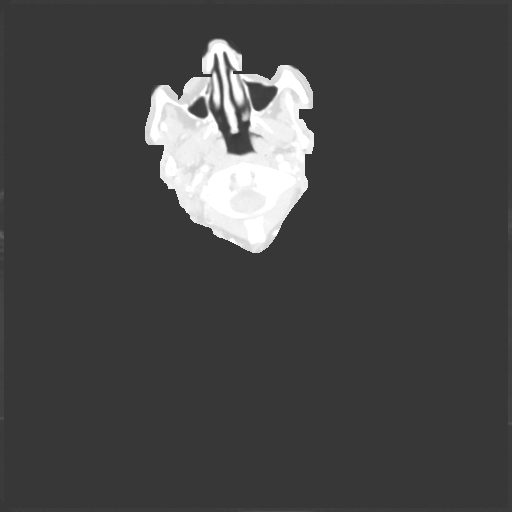

[Series 8: coronal · coronal · 0.50mm/px · 1 of 104 slices shown]
[im 52/104  lung]
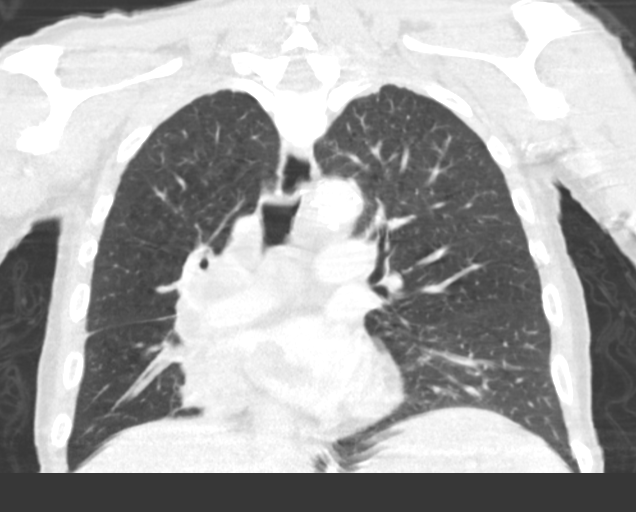

[14 of 36 positions shown; findings below may reference images not displayed]

FINDINGS: Cardiovascular: Heart size normal. Coronary artery calcification is
noted. Atherosclerotic calcification is noted in the wall of the
thoracic aorta.

A large right lower lobe mass has invaded the right inferior
pulmonary vein and grown into the left atrium with evidence of
tumor/thrombus extending to the mitral valve.

Mediastinum/Nodes: Necrotic 2.3 cm right hilar lymph node is
associated with a necrotic 3.0 cm short axis subcarinal lymph node.
The confluent mass in the right lower lobe and dates the right
mainstem bronchus and occludes the bronchus intermedius.

Lungs/Pleura: Emphysema with bronchial wall thickening is noted
bilaterally. A large 8.5 x 6.8 cm necrotic mass is identified in the
right lower lobe, growing into the right hilum and mediastinum with
invasion of the right mainstem bronchus and right inferior pulmonary
vein with tumor/thrombus visualized in the left atrium extending to
the mitral valve. Right lower lobe collapse around a large mass
lesion is compatible with the airway involvement.

Upper Abdomen: 4.3 cm heterogeneous right adrenal mass is been
incompletely visualized. 3.1 cm lesion identified left adrenal
gland.

Musculoskeletal: 2.8 x 2.3 cm lesion identified in the anterior
right fifth rib.
IMPRESSION: 1. 9 cm necrotic right lower lobe mass extends into the right hilum
and mediastinum. There is direct tumor invasion of the right
inferior pulmonary vein with tumor extending through the left atrium
to the level of the mitral valve. There is also evidence of invasion
into the right mainstem bronchus with occlusion of the bronchus
intermedius.
2. Metastatic lymphadenopathy in the right hilum and mediastinum.
3. Bilateral adrenal metastases.
4. Bony metastasis involving the right fifth rib.

## 2017-07-15 IMAGING — CR DG CHEST 2V
1 series · 2 of 2 positions shown · non-contrast
Comparison: 09/12/2007

CLINICAL DATA: Smoker, shortness of breath

EXAM:
CHEST  2 VIEW

[Series 1: dg chest 2 view · 0.14mm/px · 2 of 2 slices shown]
[im 1/2]
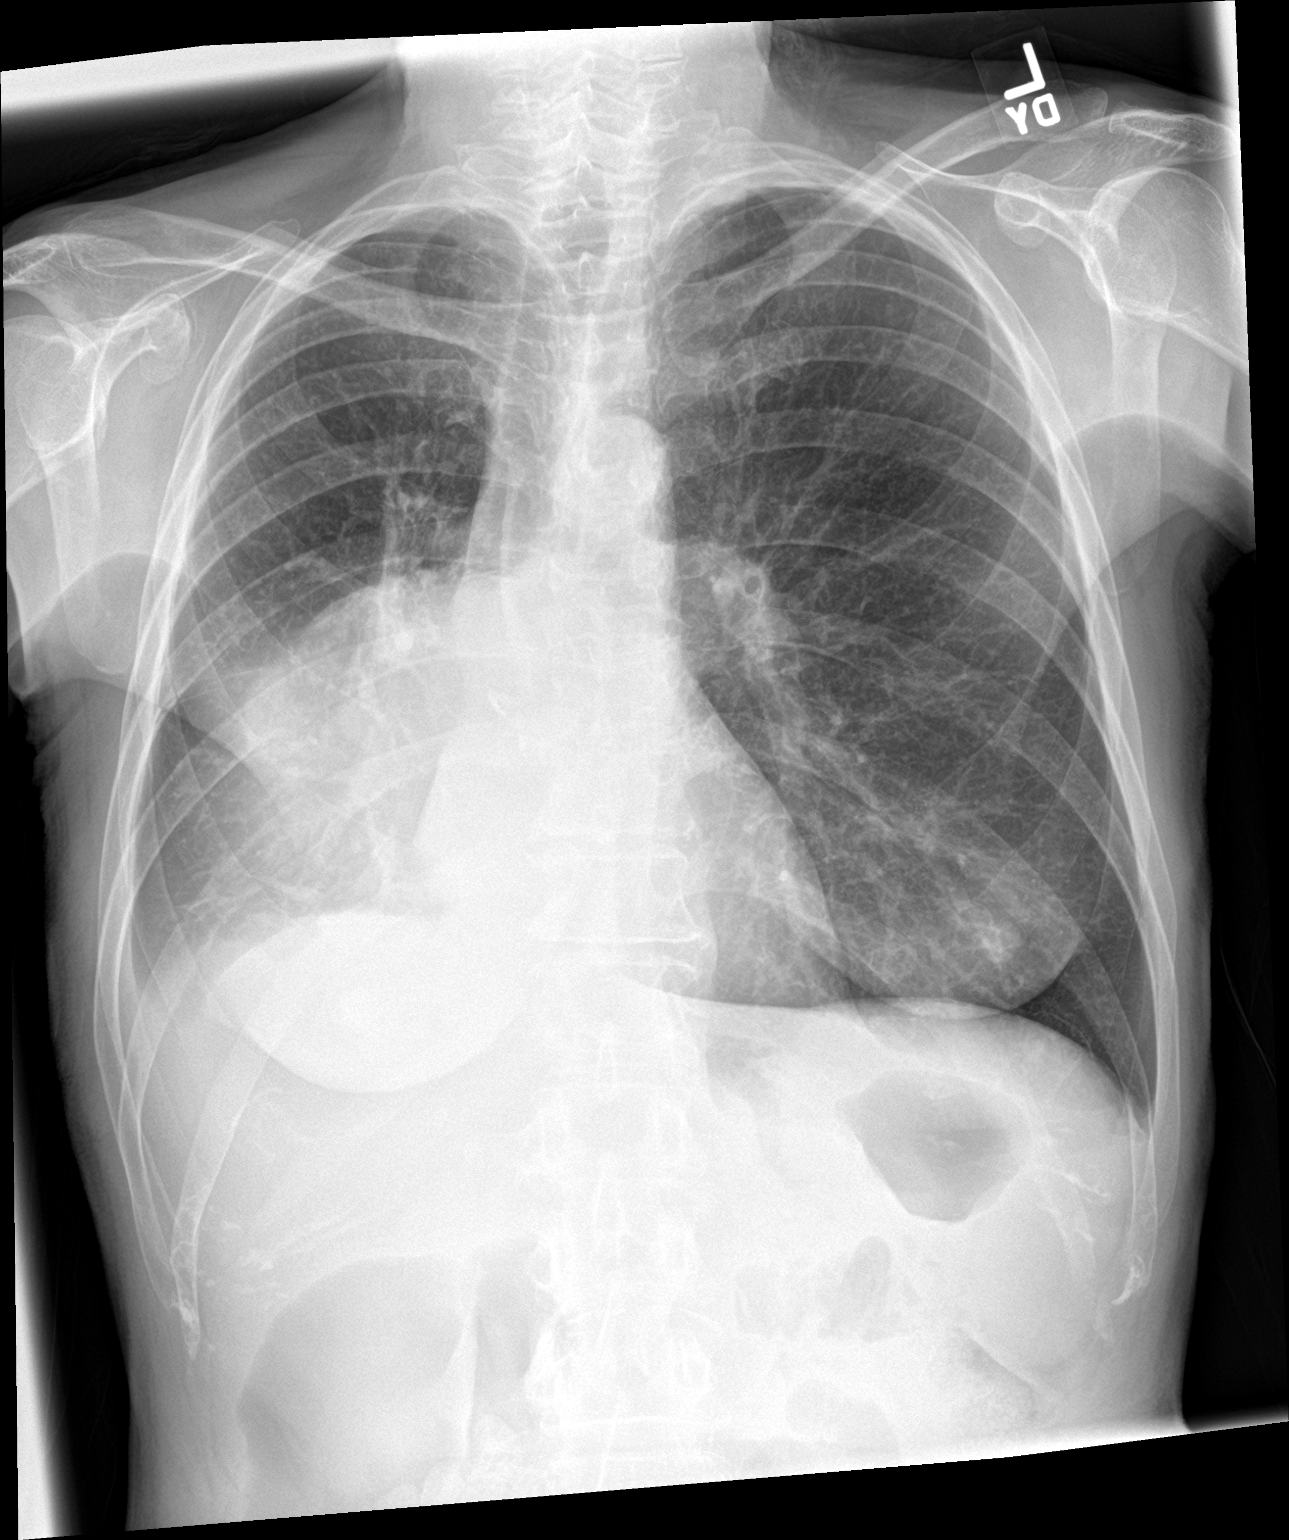
[im 2/2]
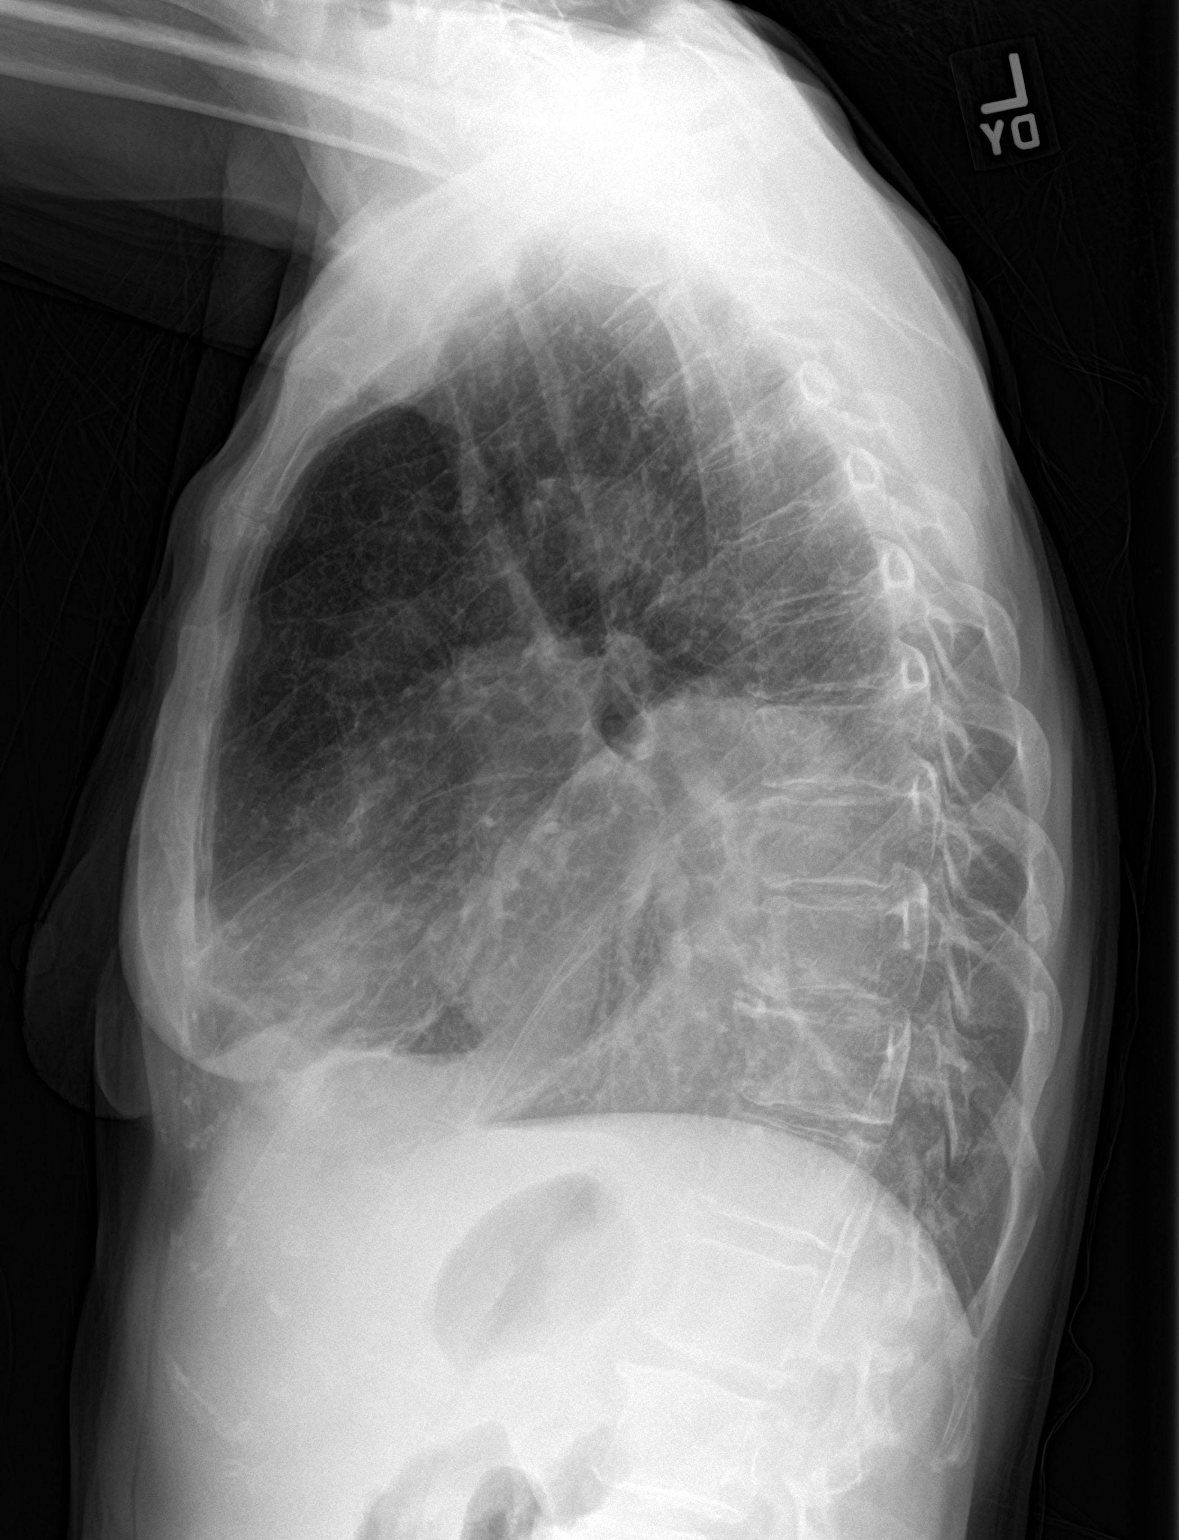

[2 of 2 positions shown; findings below may reference images not displayed]

FINDINGS: Large mass in the right lower lobe. Small right pleural effusion.
Left lung is clear. No left pleural effusion. No pneumothorax.
Stable cardiomediastinal silhouette. The heart and mediastinal
contours are unremarkable.

The osseous structures are unremarkable.
IMPRESSION: Large right lower lobe mass. Findings concerning for malignancy
until proven otherwise. Further evaluation with a CT of the chest is
recommended.
